# Patient Record
Sex: Male | Born: 1960 | Hispanic: Yes | Marital: Single | Smoking: Never smoker
Health system: Southern US, Community
[De-identification: ages and names within clinical notes are randomized; demographics above are authoritative.]

## PROBLEM LIST (undated history)

## (undated) DIAGNOSIS — E119 Type 2 diabetes mellitus without complications: Secondary | ICD-10-CM

## (undated) HISTORY — PX: PLEURAL SCARIFICATION: SHX748

## (undated) HISTORY — PX: FRACTURE SURGERY: SHX138

## (undated) HISTORY — PX: LUNG TRANSPLANT: SHX234

---

## 2005-07-31 ENCOUNTER — Emergency Department (HOSPITAL_COMMUNITY): Admission: EM | Admit: 2005-07-31 | Discharge: 2005-07-31 | Payer: Self-pay | Admitting: *Deleted

## 2005-08-08 ENCOUNTER — Emergency Department (HOSPITAL_COMMUNITY): Admission: EM | Admit: 2005-08-08 | Discharge: 2005-08-09 | Payer: Self-pay | Admitting: *Deleted

## 2005-08-14 ENCOUNTER — Emergency Department (HOSPITAL_COMMUNITY): Admission: EM | Admit: 2005-08-14 | Discharge: 2005-08-15 | Payer: Self-pay | Admitting: *Deleted

## 2005-08-21 ENCOUNTER — Ambulatory Visit: Payer: Self-pay | Admitting: Internal Medicine

## 2005-08-21 ENCOUNTER — Observation Stay (HOSPITAL_COMMUNITY): Admission: EM | Admit: 2005-08-21 | Discharge: 2005-08-22 | Payer: Self-pay | Admitting: Emergency Medicine

## 2005-08-24 ENCOUNTER — Ambulatory Visit (HOSPITAL_COMMUNITY): Admission: RE | Admit: 2005-08-24 | Discharge: 2005-08-24 | Payer: Self-pay | Admitting: Family Medicine

## 2020-06-03 DIAGNOSIS — Z938 Other artificial opening status: Secondary | ICD-10-CM | POA: Insufficient documentation

## 2020-06-03 DIAGNOSIS — Z9911 Dependence on respirator [ventilator] status: Secondary | ICD-10-CM

## 2020-06-03 DIAGNOSIS — Z930 Tracheostomy status: Secondary | ICD-10-CM

## 2020-06-03 DIAGNOSIS — J9601 Acute respiratory failure with hypoxia: Secondary | ICD-10-CM | POA: Insufficient documentation

## 2020-06-03 DIAGNOSIS — I3139 Other pericardial effusion (noninflammatory): Secondary | ICD-10-CM

## 2020-06-03 DIAGNOSIS — S2243XA Multiple fractures of ribs, bilateral, initial encounter for closed fracture: Secondary | ICD-10-CM

## 2020-06-03 DIAGNOSIS — I7774 Dissection of vertebral artery: Secondary | ICD-10-CM

## 2020-06-03 DIAGNOSIS — S72302G Unspecified fracture of shaft of left femur, subsequent encounter for closed fracture with delayed healing: Secondary | ICD-10-CM

## 2020-06-03 DIAGNOSIS — T07XXXA Unspecified multiple injuries, initial encounter: Secondary | ICD-10-CM | POA: Insufficient documentation

## 2020-06-03 DIAGNOSIS — S42002A Fracture of unspecified part of left clavicle, initial encounter for closed fracture: Secondary | ICD-10-CM | POA: Insufficient documentation

## 2020-06-03 DIAGNOSIS — S272XXA Traumatic hemopneumothorax, initial encounter: Secondary | ICD-10-CM

## 2020-06-03 HISTORY — DX: Dissection of vertebral artery: I77.74

## 2020-06-03 HISTORY — DX: Other artificial opening status: Z93.8

## 2020-06-03 HISTORY — DX: Multiple fractures of ribs, bilateral, initial encounter for closed fracture: S22.43XA

## 2020-06-03 HISTORY — DX: Acute respiratory failure with hypoxia: J96.01

## 2020-06-03 HISTORY — DX: Traumatic hemopneumothorax, initial encounter: S27.2XXA

## 2020-06-03 HISTORY — DX: Unspecified fracture of shaft of left femur, subsequent encounter for closed fracture with delayed healing: S72.302G

## 2020-06-03 HISTORY — DX: Other pericardial effusion (noninflammatory): I31.39

## 2020-06-03 HISTORY — DX: Unspecified multiple injuries, initial encounter: T07.XXXA

## 2020-06-03 HISTORY — DX: Tracheostomy status: Z93.0

## 2020-06-03 HISTORY — DX: Dependence on respirator (ventilator) status: Z99.11

## 2020-07-15 DIAGNOSIS — F324 Major depressive disorder, single episode, in partial remission: Secondary | ICD-10-CM

## 2020-07-15 HISTORY — DX: Major depressive disorder, single episode, in partial remission: F32.4

## 2022-03-04 ENCOUNTER — Encounter (HOSPITAL_COMMUNITY): Payer: Self-pay | Admitting: Emergency Medicine

## 2022-03-04 ENCOUNTER — Emergency Department (HOSPITAL_COMMUNITY)
Admission: EM | Admit: 2022-03-04 | Discharge: 2022-03-04 | Disposition: A | Discharge status: Home / Self Care | Payer: Medicaid Other | Attending: Emergency Medicine | Admitting: Emergency Medicine

## 2022-03-04 ENCOUNTER — Other Ambulatory Visit: Payer: Self-pay

## 2022-03-04 DIAGNOSIS — J020 Streptococcal pharyngitis: Secondary | ICD-10-CM | POA: Diagnosis not present

## 2022-03-04 DIAGNOSIS — E1165 Type 2 diabetes mellitus with hyperglycemia: Secondary | ICD-10-CM | POA: Diagnosis not present

## 2022-03-04 DIAGNOSIS — R0602 Shortness of breath: Secondary | ICD-10-CM | POA: Diagnosis not present

## 2022-03-04 DIAGNOSIS — D72829 Elevated white blood cell count, unspecified: Secondary | ICD-10-CM | POA: Diagnosis not present

## 2022-03-04 DIAGNOSIS — R059 Cough, unspecified: Secondary | ICD-10-CM | POA: Diagnosis present

## 2022-03-04 MED ORDER — PENICILLIN G BENZATHINE 1200000 UNIT/2ML IM SUSY
1.2000 10*6.[IU] | PREFILLED_SYRINGE | Freq: Once | INTRAMUSCULAR | Status: AC
  Administered 2022-03-04: 1.2 10*6.[IU] via INTRAMUSCULAR
  Filled 2022-03-04: qty 2
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
## 2022-03-04 NOTE — Discharge Instructions (Signed)
Su prueba de estreptococos en la sala de urgencias dio positivo. Le hemos dado antibi?ticos para tratar esta infecci?n. ?Puede tomar Motrin y Tylenol seg?n lo necesite seg?n las indicaciones para Fish farm manager. ?Regrese a la sala de emergencias si tiene s?ntomas graves o preocupantes; de lo contrario, vuelva a Science writer con su proveedor de atenci?n primaria. ? ?Your strep test at the urgent care was positive.  We have given you antibiotics to treat this infection. ?You can take Motrin and Tylenol as needed as directed for discomfort in your throat. ?Return to the ER for severe or concerning symptoms otherwise recheck with your primary care provider. ?

## 2022-03-04 NOTE — ED Triage Notes (Signed)
Pt was sent by [facility] in [place], pt c/o SOB, decreased appetite, weakness x 1.5 months ?

## 2022-03-04 NOTE — ED Notes (Signed)
Pt ambulated to room with cane independently on RA.  ?

## 2022-06-03 ENCOUNTER — Emergency Department (HOSPITAL_COMMUNITY): Payer: Medicaid Other

## 2022-06-03 ENCOUNTER — Inpatient Hospital Stay (HOSPITAL_COMMUNITY)
Admission: EM | Admit: 2022-06-03 | Discharge: 2022-06-06 | DRG: 871 | Disposition: A | Discharge status: Home / Self Care | Payer: Medicaid Other | Attending: Family Medicine | Admitting: Family Medicine

## 2022-06-03 ENCOUNTER — Other Ambulatory Visit: Payer: Self-pay

## 2022-06-03 ENCOUNTER — Encounter (HOSPITAL_COMMUNITY): Payer: Self-pay | Admitting: Emergency Medicine

## 2022-06-03 DIAGNOSIS — Z6820 Body mass index (BMI) 20.0-20.9, adult: Secondary | ICD-10-CM

## 2022-06-03 DIAGNOSIS — I248 Other forms of acute ischemic heart disease: Secondary | ICD-10-CM | POA: Diagnosis present

## 2022-06-03 DIAGNOSIS — J9601 Acute respiratory failure with hypoxia: Secondary | ICD-10-CM | POA: Diagnosis not present

## 2022-06-03 DIAGNOSIS — E1165 Type 2 diabetes mellitus with hyperglycemia: Secondary | ICD-10-CM | POA: Diagnosis present

## 2022-06-03 DIAGNOSIS — Z20822 Contact with and (suspected) exposure to covid-19: Secondary | ICD-10-CM | POA: Diagnosis present

## 2022-06-03 DIAGNOSIS — J189 Pneumonia, unspecified organism: Secondary | ICD-10-CM | POA: Diagnosis present

## 2022-06-03 DIAGNOSIS — E871 Hypo-osmolality and hyponatremia: Secondary | ICD-10-CM | POA: Diagnosis present

## 2022-06-03 DIAGNOSIS — E440 Moderate protein-calorie malnutrition: Secondary | ICD-10-CM | POA: Insufficient documentation

## 2022-06-03 DIAGNOSIS — A419 Sepsis, unspecified organism: Principal | ICD-10-CM | POA: Diagnosis present

## 2022-06-03 DIAGNOSIS — D509 Iron deficiency anemia, unspecified: Secondary | ICD-10-CM | POA: Diagnosis present

## 2022-06-03 DIAGNOSIS — R6520 Severe sepsis without septic shock: Secondary | ICD-10-CM | POA: Diagnosis present

## 2022-06-03 HISTORY — DX: Acute respiratory failure with hypoxia: J96.01

## 2022-06-03 HISTORY — DX: Type 2 diabetes mellitus without complications: E11.9

## 2022-06-03 LAB — CBC
HCT: 34.1 % — ABNORMAL LOW (ref 39.0–52.0)
Hemoglobin: 11.4 g/dL — ABNORMAL LOW (ref 13.0–17.0)
MCH: 29.2 pg (ref 26.0–34.0)
MCHC: 33.4 g/dL (ref 30.0–36.0)
MCV: 87.4 fL (ref 80.0–100.0)
Platelets: 187 10*3/uL (ref 150–400)
RBC: 3.9 MIL/uL — ABNORMAL LOW (ref 4.22–5.81)
RDW: 12.8 % (ref 11.5–15.5)
WBC: 12.1 10*3/uL — ABNORMAL HIGH (ref 4.0–10.5)
nRBC: 0 % (ref 0.0–0.2)

## 2022-06-03 LAB — COMPREHENSIVE METABOLIC PANEL
ALT: 27 U/L (ref 0–44)
AST: 24 U/L (ref 15–41)
Albumin: 4.5 g/dL (ref 3.5–5.0)
Alkaline Phosphatase: 90 U/L (ref 38–126)
Anion gap: 8 (ref 5–15)
BUN: 17 mg/dL (ref 6–20)
CO2: 29 mmol/L (ref 22–32)
Calcium: 9 mg/dL (ref 8.9–10.3)
Chloride: 96 mmol/L — ABNORMAL LOW (ref 98–111)
Creatinine, Ser: 0.63 mg/dL (ref 0.61–1.24)
GFR, Estimated: >60 mL/min (ref >60)
Glucose, Bld: 141 mg/dL — ABNORMAL HIGH (ref 70–99)
Potassium: 4.1 mmol/L (ref 3.5–5.1)
Sodium: 133 mmol/L — ABNORMAL LOW (ref 135–145)
Total Bilirubin: 0.6 mg/dL (ref 0.3–1.2)
Total Protein: 8.5 g/dL — ABNORMAL HIGH (ref 6.5–8.1)

## 2022-06-03 LAB — CBC WITH DIFFERENTIAL/PLATELET
Abs Immature Granulocytes: 0.07 10*3/uL (ref 0.00–0.07)
Basophils Absolute: 0.1 10*3/uL (ref 0.0–0.1)
Basophils Relative: 0 %
Eosinophils Absolute: 0.3 10*3/uL (ref 0.0–0.5)
Eosinophils Relative: 1 %
HCT: 35.8 % — ABNORMAL LOW (ref 39.0–52.0)
Hemoglobin: 12.1 g/dL — ABNORMAL LOW (ref 13.0–17.0)
Immature Granulocytes: 0 %
Lymphocytes Relative: 11 %
Lymphs Abs: 2.2 10*3/uL (ref 0.7–4.0)
MCH: 29.5 pg (ref 26.0–34.0)
MCHC: 33.8 g/dL (ref 30.0–36.0)
MCV: 87.3 fL (ref 80.0–100.0)
Monocytes Absolute: 0.7 10*3/uL (ref 0.1–1.0)
Monocytes Relative: 3 %
Neutro Abs: 17.1 10*3/uL — ABNORMAL HIGH (ref 1.7–7.7)
Neutrophils Relative %: 85 %
Platelets: 215 10*3/uL (ref 150–400)
RBC: 4.1 MIL/uL — ABNORMAL LOW (ref 4.22–5.81)
RDW: 12.6 % (ref 11.5–15.5)
WBC: 20.5 10*3/uL — ABNORMAL HIGH (ref 4.0–10.5)
nRBC: 0 % (ref 0.0–0.2)

## 2022-06-03 LAB — IRON AND TIBC
Iron: 25 ug/dL — ABNORMAL LOW (ref 45–182)
Saturation Ratios: 12 % — ABNORMAL LOW (ref 17.9–39.5)
TIBC: 217 ug/dL — ABNORMAL LOW (ref 250–450)
UIBC: 192 ug/dL

## 2022-06-03 LAB — GLUCOSE, CAPILLARY: Glucose-Capillary: 94 mg/dL (ref 70–99)

## 2022-06-03 LAB — URINALYSIS, ROUTINE W REFLEX MICROSCOPIC
Bacteria, UA: NONE SEEN
Bilirubin Urine: NEGATIVE
Glucose, UA: NEGATIVE mg/dL
Ketones, ur: NEGATIVE mg/dL
Leukocytes,Ua: NEGATIVE
Nitrite: NEGATIVE
Protein, ur: 30 mg/dL — AB
Specific Gravity, Urine: 1.009 (ref 1.005–1.030)
pH: 7 (ref 5.0–8.0)

## 2022-06-03 LAB — PROTIME-INR
INR: 1.1 (ref 0.8–1.2)
Prothrombin Time: 13.9 seconds (ref 11.4–15.2)

## 2022-06-03 LAB — APTT: aPTT: 38 seconds — ABNORMAL HIGH (ref 24–36)

## 2022-06-03 LAB — RESP PANEL BY RT-PCR (FLU A&B, COVID) ARPGX2
Influenza A by PCR: NEGATIVE
Influenza B by PCR: NEGATIVE
SARS Coronavirus 2 by RT PCR: NEGATIVE

## 2022-06-03 LAB — FERRITIN: Ferritin: 321 ng/mL (ref 24–336)

## 2022-06-03 LAB — RETICULOCYTES
Immature Retic Fract: 6.2 % (ref 2.3–15.9)
RBC.: 3.87 MIL/uL — ABNORMAL LOW (ref 4.22–5.81)
Retic Count, Absolute: 39.5 10*3/uL (ref 19.0–186.0)
Retic Ct Pct: 1 % (ref 0.4–3.1)

## 2022-06-03 LAB — CBG MONITORING, ED: Glucose-Capillary: 124 mg/dL — ABNORMAL HIGH (ref 70–99)

## 2022-06-03 LAB — D-DIMER, QUANTITATIVE: D-Dimer, Quant: 0.67 ug/mL-FEU — ABNORMAL HIGH (ref 0.00–0.50)

## 2022-06-03 LAB — VITAMIN B12: Vitamin B-12: 396 pg/mL (ref 180–914)

## 2022-06-03 LAB — TROPONIN I (HIGH SENSITIVITY)
Troponin I (High Sensitivity): 3 ng/L (ref <18)
Troponin I (High Sensitivity): 27 ng/L — ABNORMAL HIGH (ref <18)

## 2022-06-03 LAB — MAGNESIUM: Magnesium: 1.6 mg/dL — ABNORMAL LOW (ref 1.7–2.4)

## 2022-06-03 LAB — FOLATE: Folate: 4.9 ng/mL — ABNORMAL LOW (ref >5.9)

## 2022-06-03 LAB — LACTIC ACID, PLASMA
Lactic Acid, Venous: 1.1 mmol/L (ref 0.5–1.9)
Lactic Acid, Venous: 0.7 mmol/L (ref 0.5–1.9)

## 2022-06-03 LAB — BRAIN NATRIURETIC PEPTIDE: B Natriuretic Peptide: 84 pg/mL (ref 0.0–100.0)

## 2022-06-03 LAB — CREATININE, SERUM
Creatinine, Ser: 0.6 mg/dL — ABNORMAL LOW (ref 0.61–1.24)
GFR, Estimated: >60 mL/min (ref >60)

## 2022-06-03 MED ORDER — ACETAMINOPHEN 650 MG RE SUPP
650.0000 mg | Freq: Once | RECTAL | Status: AC
Start: 2022-06-03 — End: 2022-06-03

## 2022-06-03 MED ORDER — ALBUTEROL SULFATE (2.5 MG/3ML) 0.083% IN NEBU: 2.5000 mg | INHALATION_SOLUTION | Freq: Four times a day (QID) | RESPIRATORY_TRACT | Status: DC | PRN

## 2022-06-03 MED ORDER — ONDANSETRON HCL 4 MG/2ML IJ SOLN
4.0000 mg | Freq: Four times a day (QID) | INTRAMUSCULAR | Status: DC | PRN
  Administered 2022-06-04 – 2022-06-05 (×2): 4 mg via INTRAVENOUS
  Filled 2022-06-03 (×2): qty 2

## 2022-06-03 MED ORDER — ACETAMINOPHEN 325 MG PO TABS
650.0000 mg | ORAL_TABLET | Freq: Once | ORAL | Status: DC
  Filled 2022-06-03: qty 2

## 2022-06-03 MED ORDER — ACETAMINOPHEN 325 MG PO TABS
650.0000 mg | ORAL_TABLET | Freq: Four times a day (QID) | ORAL | Status: DC | PRN
  Administered 2022-06-04: 650 mg via ORAL
  Filled 2022-06-03: qty 2

## 2022-06-03 MED ORDER — SODIUM CHLORIDE 0.9 % IV SOLN: INTRAVENOUS | Status: DC

## 2022-06-03 MED ORDER — LIDOCAINE-EPINEPHRINE (PF) 2 %-1:200000 IJ SOLN
10.0000 mL | Freq: Once | INTRAMUSCULAR | Status: DC
  Filled 2022-06-03: qty 20

## 2022-06-03 MED ORDER — SODIUM CHLORIDE 0.9 % IV SOLN
500.0000 mg | INTRAVENOUS | Status: DC
  Administered 2022-06-03 – 2022-06-04 (×2): 500 mg via INTRAVENOUS
  Filled 2022-06-03 (×2): qty 5

## 2022-06-03 MED ORDER — ACETAMINOPHEN 650 MG RE SUPP
RECTAL | Status: AC
  Administered 2022-06-03: 650 mg via RECTAL
  Filled 2022-06-03: qty 1

## 2022-06-03 MED ORDER — INSULIN ASPART 100 UNIT/ML IJ SOLN
0.0000 [IU] | Freq: Three times a day (TID) | INTRAMUSCULAR | Status: DC
  Administered 2022-06-05: 3 [IU] via SUBCUTANEOUS
  Administered 2022-06-05 – 2022-06-06 (×3): 1 [IU] via SUBCUTANEOUS

## 2022-06-03 MED ORDER — ACETAMINOPHEN 650 MG RE SUPP: 650.0000 mg | Freq: Four times a day (QID) | RECTAL | Status: DC | PRN

## 2022-06-03 MED ORDER — LACTATED RINGERS IV BOLUS (SEPSIS)
1000.0000 mL | Freq: Once | INTRAVENOUS | Status: AC
  Administered 2022-06-03: 1000 mL via INTRAVENOUS

## 2022-06-03 MED ORDER — GUAIFENESIN-DM 100-10 MG/5ML PO SYRP
5.0000 mL | ORAL_SOLUTION | ORAL | Status: DC | PRN
Start: 2022-06-03 — End: 2022-06-04
  Administered 2022-06-04: 5 mL via ORAL
  Filled 2022-06-03: qty 5

## 2022-06-03 MED ORDER — ALBUTEROL SULFATE HFA 108 (90 BASE) MCG/ACT IN AERS
1.0000 | INHALATION_SPRAY | Freq: Four times a day (QID) | RESPIRATORY_TRACT | Status: DC | PRN
Start: 2022-06-03 — End: 2022-06-03

## 2022-06-03 MED ORDER — LACTATED RINGERS IV BOLUS (SEPSIS)
1000.0000 mL | Freq: Once | INTRAVENOUS | Status: AC
Start: 2022-06-03 — End: 2022-06-03
  Administered 2022-06-03: 1000 mL via INTRAVENOUS

## 2022-06-03 MED ORDER — ENOXAPARIN SODIUM 40 MG/0.4ML IJ SOSY
40.0000 mg | PREFILLED_SYRINGE | INTRAMUSCULAR | Status: DC
  Administered 2022-06-03 – 2022-06-05 (×3): 40 mg via SUBCUTANEOUS
  Filled 2022-06-03 (×3): qty 0.4

## 2022-06-03 MED ORDER — INSULIN ASPART 100 UNIT/ML IJ SOLN
0.0000 [IU] | Freq: Every day | INTRAMUSCULAR | Status: DC
  Administered 2022-06-05: 3 [IU] via SUBCUTANEOUS

## 2022-06-03 MED ORDER — LACTATED RINGERS IV SOLN: INTRAVENOUS | Status: DC

## 2022-06-03 MED ORDER — SODIUM CHLORIDE 0.9 % IV SOLN
2.0000 g | INTRAVENOUS | Status: DC
  Administered 2022-06-03 – 2022-06-05 (×3): 2 g via INTRAVENOUS
  Filled 2022-06-03 (×3): qty 20

## 2022-06-03 MED ORDER — MAGNESIUM SULFATE 2 GM/50ML IV SOLN
2.0000 g | Freq: Once | INTRAVENOUS | Status: AC
Start: 2022-06-03 — End: 2022-06-03
  Administered 2022-06-03: 2 g via INTRAVENOUS
  Filled 2022-06-03: qty 50

## 2022-06-03 MED ORDER — ONDANSETRON HCL 4 MG PO TABS
4.0000 mg | ORAL_TABLET | Freq: Four times a day (QID) | ORAL | Status: DC | PRN
  Administered 2022-06-04: 4 mg via ORAL
  Filled 2022-06-03: qty 1
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
## 2022-06-03 NOTE — ED Triage Notes (Signed)
SOB and chest pain x 2 hours.
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
## 2022-06-03 NOTE — Sepsis Progress Note (Signed)
ELink tracking the code sepsis.  

## 2022-06-03 NOTE — ED Notes (Signed)
Patient verbalizes difficulty swallowing medication.

## 2022-06-04 ENCOUNTER — Inpatient Hospital Stay (HOSPITAL_COMMUNITY): Payer: Medicaid Other

## 2022-06-04 ENCOUNTER — Encounter (HOSPITAL_COMMUNITY): Payer: Self-pay | Admitting: Family Medicine

## 2022-06-04 ENCOUNTER — Observation Stay (HOSPITAL_COMMUNITY): Payer: Medicaid Other

## 2022-06-04 DIAGNOSIS — R6520 Severe sepsis without septic shock: Secondary | ICD-10-CM

## 2022-06-04 DIAGNOSIS — E871 Hypo-osmolality and hyponatremia: Secondary | ICD-10-CM | POA: Diagnosis present

## 2022-06-04 DIAGNOSIS — A419 Sepsis, unspecified organism: Secondary | ICD-10-CM | POA: Diagnosis present

## 2022-06-04 DIAGNOSIS — Z6820 Body mass index (BMI) 20.0-20.9, adult: Secondary | ICD-10-CM | POA: Diagnosis not present

## 2022-06-04 DIAGNOSIS — I248 Other forms of acute ischemic heart disease: Secondary | ICD-10-CM | POA: Diagnosis present

## 2022-06-04 DIAGNOSIS — Z20822 Contact with and (suspected) exposure to covid-19: Secondary | ICD-10-CM | POA: Diagnosis present

## 2022-06-04 DIAGNOSIS — E1165 Type 2 diabetes mellitus with hyperglycemia: Secondary | ICD-10-CM | POA: Diagnosis present

## 2022-06-04 DIAGNOSIS — J9601 Acute respiratory failure with hypoxia: Secondary | ICD-10-CM

## 2022-06-04 DIAGNOSIS — D509 Iron deficiency anemia, unspecified: Secondary | ICD-10-CM | POA: Diagnosis present

## 2022-06-04 DIAGNOSIS — R778 Other specified abnormalities of plasma proteins: Secondary | ICD-10-CM | POA: Diagnosis not present

## 2022-06-04 DIAGNOSIS — E440 Moderate protein-calorie malnutrition: Secondary | ICD-10-CM | POA: Diagnosis present

## 2022-06-04 DIAGNOSIS — J189 Pneumonia, unspecified organism: Secondary | ICD-10-CM | POA: Diagnosis present

## 2022-06-04 HISTORY — DX: Hypomagnesemia: E83.42

## 2022-06-04 HISTORY — DX: Hypo-osmolality and hyponatremia: E87.1

## 2022-06-04 HISTORY — DX: Sepsis, unspecified organism: A41.9

## 2022-06-04 LAB — GLUCOSE, CAPILLARY
Glucose-Capillary: 90 mg/dL (ref 70–99)
Glucose-Capillary: 107 mg/dL — ABNORMAL HIGH (ref 70–99)
Glucose-Capillary: 114 mg/dL — ABNORMAL HIGH (ref 70–99)
Glucose-Capillary: 119 mg/dL — ABNORMAL HIGH (ref 70–99)

## 2022-06-04 LAB — CBC
HCT: 30.2 % — ABNORMAL LOW (ref 39.0–52.0)
Hemoglobin: 9.9 g/dL — ABNORMAL LOW (ref 13.0–17.0)
MCH: 28.9 pg (ref 26.0–34.0)
MCHC: 32.8 g/dL (ref 30.0–36.0)
MCV: 88 fL (ref 80.0–100.0)
Platelets: 172 10*3/uL (ref 150–400)
RBC: 3.43 MIL/uL — ABNORMAL LOW (ref 4.22–5.81)
RDW: 13 % (ref 11.5–15.5)
WBC: 19 10*3/uL — ABNORMAL HIGH (ref 4.0–10.5)
nRBC: 0 % (ref 0.0–0.2)

## 2022-06-04 LAB — LIPID PANEL
Cholesterol: 97 mg/dL (ref 0–200)
HDL: 44 mg/dL (ref >40)
LDL Cholesterol: 45 mg/dL (ref 0–99)
Total CHOL/HDL Ratio: 2.2 RATIO
Triglycerides: 39 mg/dL (ref <150)
VLDL: 8 mg/dL (ref 0–40)

## 2022-06-04 LAB — ECHOCARDIOGRAM COMPLETE
AR max vel: 2.38 cm2
AV Area VTI: 2.54 cm2
AV Area mean vel: 2.51 cm2
AV Mean grad: 5 mmHg
AV Peak grad: 9.7 mmHg
Ao pk vel: 1.56 m/s
Area-P 1/2: 5.16 cm2
Calc EF: 71 %
Height: 67 in
MV VTI: 3.57 cm2
S' Lateral: 2.5 cm
Single Plane A2C EF: 71.4 %
Single Plane A4C EF: 68.1 %
Weight: 2119.94 oz

## 2022-06-04 LAB — BASIC METABOLIC PANEL
Anion gap: 4 — ABNORMAL LOW (ref 5–15)
BUN: 17 mg/dL (ref 6–20)
CO2: 29 mmol/L (ref 22–32)
Calcium: 7.9 mg/dL — ABNORMAL LOW (ref 8.9–10.3)
Chloride: 101 mmol/L (ref 98–111)
Creatinine, Ser: 0.73 mg/dL (ref 0.61–1.24)
GFR, Estimated: >60 mL/min (ref >60)
Glucose, Bld: 89 mg/dL (ref 70–99)
Potassium: 4 mmol/L (ref 3.5–5.1)
Sodium: 134 mmol/L — ABNORMAL LOW (ref 135–145)

## 2022-06-04 LAB — STREP PNEUMONIAE URINARY ANTIGEN: Strep Pneumo Urinary Antigen: NEGATIVE

## 2022-06-04 LAB — LACTIC ACID, PLASMA: Lactic Acid, Venous: 0.7 mmol/L (ref 0.5–1.9)

## 2022-06-04 LAB — HEMOGLOBIN A1C
Hgb A1c MFr Bld: 6 % — ABNORMAL HIGH (ref 4.8–5.6)
Mean Plasma Glucose: 125.5 mg/dL

## 2022-06-04 LAB — MAGNESIUM: Magnesium: 2 mg/dL (ref 1.7–2.4)

## 2022-06-04 LAB — PROCALCITONIN: Procalcitonin: 3.77 ng/mL

## 2022-06-04 MED ORDER — LACTATED RINGERS IV BOLUS (SEPSIS)
1000.0000 mL | Freq: Once | INTRAVENOUS | Status: AC
Start: 2022-06-04 — End: 2022-06-04
  Administered 2022-06-04: 1000 mL via INTRAVENOUS

## 2022-06-04 MED ORDER — GUAIFENESIN-DM 100-10 MG/5ML PO SYRP
10.0000 mL | ORAL_SOLUTION | Freq: Four times a day (QID) | ORAL | Status: DC
Start: 2022-06-04 — End: 2022-06-06
  Administered 2022-06-04 – 2022-06-06 (×7): 10 mL via ORAL
  Filled 2022-06-04 (×7): qty 10

## 2022-06-04 MED ORDER — MIDODRINE HCL 5 MG PO TABS
10.0000 mg | ORAL_TABLET | Freq: Three times a day (TID) | ORAL | Status: DC
  Administered 2022-06-04 – 2022-06-05 (×4): 10 mg via ORAL
  Filled 2022-06-04 (×4): qty 2

## 2022-06-04 MED ORDER — IOHEXOL 350 MG/ML SOLN
75.0000 mL | Freq: Once | INTRAVENOUS | Status: AC | PRN
  Administered 2022-06-04: 75 mL via INTRAVENOUS

## 2022-06-04 MED ORDER — FOLIC ACID 1 MG PO TABS
1.0000 mg | ORAL_TABLET | Freq: Every day | ORAL | Status: DC
  Administered 2022-06-04 – 2022-06-06 (×3): 1 mg via ORAL
  Filled 2022-06-04 (×3): qty 1

## 2022-06-04 MED ORDER — RISAQUAD PO CAPS
1.0000 | ORAL_CAPSULE | Freq: Three times a day (TID) | ORAL | Status: DC
  Administered 2022-06-04 – 2022-06-06 (×7): 1 via ORAL
  Filled 2022-06-04 (×7): qty 1

## 2022-06-04 MED ORDER — ENSURE ENLIVE PO LIQD
237.0000 mL | Freq: Two times a day (BID) | ORAL | Status: DC
Start: 2022-06-04 — End: 2022-06-06
  Administered 2022-06-04 – 2022-06-05 (×3): 237 mL via ORAL

## 2022-06-04 MED ORDER — ADULT MULTIVITAMIN W/MINERALS CH
1.0000 | ORAL_TABLET | Freq: Every day | ORAL | Status: DC
  Administered 2022-06-04 – 2022-06-06 (×3): 1 via ORAL
  Filled 2022-06-04 (×3): qty 1

## 2022-06-04 MED ORDER — FERROUS SULFATE 325 (65 FE) MG PO TABS
325.0000 mg | ORAL_TABLET | Freq: Every day | ORAL | Status: DC
  Administered 2022-06-05 – 2022-06-06 (×2): 325 mg via ORAL
  Filled 2022-06-04 (×2): qty 1

## 2022-06-04 MED ORDER — ORAL CARE MOUTH RINSE: 15.0000 mL | OROMUCOSAL | Status: DC | PRN

## 2022-06-04 NOTE — Progress Notes (Signed)
*  PRELIMINARY RESULTS* Echocardiogram 2D Echocardiogram has been performed.  Carolyne Fiscal 06/04/2022, 2:03 PM
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
## 2022-06-05 DIAGNOSIS — E871 Hypo-osmolality and hyponatremia: Secondary | ICD-10-CM | POA: Diagnosis not present

## 2022-06-05 DIAGNOSIS — E440 Moderate protein-calorie malnutrition: Secondary | ICD-10-CM | POA: Insufficient documentation

## 2022-06-05 DIAGNOSIS — J9601 Acute respiratory failure with hypoxia: Secondary | ICD-10-CM | POA: Diagnosis not present

## 2022-06-05 DIAGNOSIS — A419 Sepsis, unspecified organism: Secondary | ICD-10-CM | POA: Diagnosis not present

## 2022-06-05 HISTORY — DX: Moderate protein-calorie malnutrition: E44.0

## 2022-06-05 LAB — BASIC METABOLIC PANEL
Anion gap: 2 — ABNORMAL LOW (ref 5–15)
BUN: 12 mg/dL (ref 6–20)
CO2: 29 mmol/L (ref 22–32)
Calcium: 7.8 mg/dL — ABNORMAL LOW (ref 8.9–10.3)
Chloride: 106 mmol/L (ref 98–111)
Creatinine, Ser: 0.7 mg/dL (ref 0.61–1.24)
GFR, Estimated: >60 mL/min (ref >60)
Glucose, Bld: 87 mg/dL (ref 70–99)
Potassium: 3.9 mmol/L (ref 3.5–5.1)
Sodium: 137 mmol/L (ref 135–145)

## 2022-06-05 LAB — CBC
HCT: 28.1 % — ABNORMAL LOW (ref 39.0–52.0)
Hemoglobin: 9.1 g/dL — ABNORMAL LOW (ref 13.0–17.0)
MCH: 29.3 pg (ref 26.0–34.0)
MCHC: 32.4 g/dL (ref 30.0–36.0)
MCV: 90.4 fL (ref 80.0–100.0)
Platelets: 151 10*3/uL (ref 150–400)
RBC: 3.11 MIL/uL — ABNORMAL LOW (ref 4.22–5.81)
RDW: 13.2 % (ref 11.5–15.5)
WBC: 14.7 10*3/uL — ABNORMAL HIGH (ref 4.0–10.5)
nRBC: 0 % (ref 0.0–0.2)

## 2022-06-05 LAB — GLUCOSE, CAPILLARY
Glucose-Capillary: 84 mg/dL (ref 70–99)
Glucose-Capillary: 147 mg/dL — ABNORMAL HIGH (ref 70–99)
Glucose-Capillary: 225 mg/dL — ABNORMAL HIGH (ref 70–99)
Glucose-Capillary: 257 mg/dL — ABNORMAL HIGH (ref 70–99)

## 2022-06-05 LAB — HIV ANTIBODY (ROUTINE TESTING W REFLEX): HIV Screen 4th Generation wRfx: NONREACTIVE

## 2022-06-05 MED ORDER — SODIUM CHLORIDE 0.9 % IV SOLN: INTRAVENOUS | Status: DC

## 2022-06-05 MED ORDER — PREDNISOLONE 5 MG PO TABS
40.0000 mg | ORAL_TABLET | Freq: Every day | ORAL | Status: DC
  Administered 2022-06-05 – 2022-06-06 (×2): 40 mg via ORAL
  Filled 2022-06-05 (×4): qty 8

## 2022-06-05 MED ORDER — AZITHROMYCIN 250 MG PO TABS
500.0000 mg | ORAL_TABLET | Freq: Every day | ORAL | Status: DC
Start: 2022-06-05 — End: 2022-06-06
  Administered 2022-06-05 – 2022-06-06 (×2): 500 mg via ORAL
  Filled 2022-06-05 (×2): qty 2

## 2022-06-06 DIAGNOSIS — J9601 Acute respiratory failure with hypoxia: Secondary | ICD-10-CM | POA: Diagnosis not present

## 2022-06-06 DIAGNOSIS — E871 Hypo-osmolality and hyponatremia: Secondary | ICD-10-CM | POA: Diagnosis not present

## 2022-06-06 DIAGNOSIS — A419 Sepsis, unspecified organism: Secondary | ICD-10-CM | POA: Diagnosis not present

## 2022-06-06 LAB — BASIC METABOLIC PANEL
Anion gap: 5 (ref 5–15)
BUN: 14 mg/dL (ref 6–20)
CO2: 27 mmol/L (ref 22–32)
Calcium: 8 mg/dL — ABNORMAL LOW (ref 8.9–10.3)
Chloride: 103 mmol/L (ref 98–111)
Creatinine, Ser: 0.59 mg/dL — ABNORMAL LOW (ref 0.61–1.24)
GFR, Estimated: >60 mL/min (ref >60)
Glucose, Bld: 225 mg/dL — ABNORMAL HIGH (ref 70–99)
Potassium: 3.9 mmol/L (ref 3.5–5.1)
Sodium: 135 mmol/L (ref 135–145)

## 2022-06-06 LAB — CBC
HCT: 30.6 % — ABNORMAL LOW (ref 39.0–52.0)
Hemoglobin: 10.1 g/dL — ABNORMAL LOW (ref 13.0–17.0)
MCH: 29.2 pg (ref 26.0–34.0)
MCHC: 33 g/dL (ref 30.0–36.0)
MCV: 88.4 fL (ref 80.0–100.0)
Platelets: 174 10*3/uL (ref 150–400)
RBC: 3.46 MIL/uL — ABNORMAL LOW (ref 4.22–5.81)
RDW: 13.2 % (ref 11.5–15.5)
WBC: 12.1 10*3/uL — ABNORMAL HIGH (ref 4.0–10.5)
nRBC: 0 % (ref 0.0–0.2)

## 2022-06-06 LAB — GLUCOSE, CAPILLARY
Glucose-Capillary: 122 mg/dL — ABNORMAL HIGH (ref 70–99)
Glucose-Capillary: 135 mg/dL — ABNORMAL HIGH (ref 70–99)

## 2022-06-06 LAB — PROCALCITONIN: Procalcitonin: 1.18 ng/mL

## 2022-06-06 MED ORDER — METHYLPREDNISOLONE 4 MG PO TBPK: ORAL_TABLET | ORAL | 0 refills | Status: DC

## 2022-06-06 MED ORDER — LEVOFLOXACIN 750 MG PO TABS: 750.0000 mg | ORAL_TABLET | Freq: Every day | ORAL | 0 refills | Status: AC

## 2022-06-06 MED ORDER — METFORMIN HCL 500 MG PO TABS: 500.0000 mg | ORAL_TABLET | Freq: Every day | ORAL | 3 refills | Status: AC

## 2022-06-06 MED ORDER — FERROUS SULFATE 325 (65 FE) MG PO TABS: 325.0000 mg | ORAL_TABLET | Freq: Every day | ORAL | 0 refills | Status: DC

## 2022-06-06 MED ORDER — ACETAMINOPHEN 325 MG PO TABS: 650.0000 mg | ORAL_TABLET | Freq: Four times a day (QID) | ORAL | 0 refills | Status: AC | PRN

## 2022-06-06 MED ORDER — RISAQUAD PO CAPS: 1.0000 | ORAL_CAPSULE | Freq: Three times a day (TID) | ORAL | 0 refills | Status: AC

## 2022-06-06 MED ORDER — MIDODRINE HCL 5 MG PO TABS
5.0000 mg | ORAL_TABLET | Freq: Three times a day (TID) | ORAL | Status: DC
  Administered 2022-06-06: 5 mg via ORAL
  Filled 2022-06-06: qty 1

## 2022-06-06 MED ORDER — INSULIN ASPART 100 UNIT/ML IJ SOLN: 0.0000 [IU] | Freq: Three times a day (TID) | INTRAMUSCULAR | 11 refills | Status: DC

## 2022-06-06 MED ORDER — METFORMIN HCL 500 MG PO TABS: 500.0000 mg | ORAL_TABLET | Freq: Every day | ORAL | Status: DC

## 2022-06-06 MED ORDER — ADULT MULTIVITAMIN W/MINERALS CH: 1.0000 | ORAL_TABLET | Freq: Every day | ORAL | 0 refills | Status: AC

## 2022-06-06 MED ORDER — GUAIFENESIN-DM 100-10 MG/5ML PO SYRP: 10.0000 mL | ORAL_SOLUTION | Freq: Four times a day (QID) | ORAL | 0 refills | Status: DC

## 2022-06-06 MED ORDER — FOLIC ACID 1 MG PO TABS: 1.0000 mg | ORAL_TABLET | Freq: Every day | ORAL | 0 refills | Status: AC

## 2022-06-08 LAB — CULTURE, BLOOD (ROUTINE X 2)
Culture: NO GROWTH
Culture: NO GROWTH
Special Requests: ADEQUATE
Special Requests: ADEQUATE

## 2022-06-08 LAB — LEGIONELLA PNEUMOPHILA SEROGP 1 UR AG: L. pneumophila Serogp 1 Ur Ag: NEGATIVE

## 2022-06-10 ENCOUNTER — Encounter (HOSPITAL_COMMUNITY): Payer: Self-pay | Admitting: Emergency Medicine

## 2022-06-10 ENCOUNTER — Emergency Department (HOSPITAL_COMMUNITY)
Admission: EM | Admit: 2022-06-10 | Discharge: 2022-06-10 | Disposition: A | Discharge status: Home / Self Care | Payer: Medicaid Other | Attending: Emergency Medicine | Admitting: Emergency Medicine

## 2022-06-10 ENCOUNTER — Other Ambulatory Visit: Payer: Self-pay

## 2022-06-10 ENCOUNTER — Emergency Department (HOSPITAL_COMMUNITY): Payer: Medicaid Other

## 2022-06-10 DIAGNOSIS — D72829 Elevated white blood cell count, unspecified: Secondary | ICD-10-CM | POA: Diagnosis not present

## 2022-06-10 DIAGNOSIS — Z794 Long term (current) use of insulin: Secondary | ICD-10-CM | POA: Diagnosis not present

## 2022-06-10 DIAGNOSIS — K5900 Constipation, unspecified: Secondary | ICD-10-CM | POA: Diagnosis not present

## 2022-06-10 DIAGNOSIS — R109 Unspecified abdominal pain: Secondary | ICD-10-CM | POA: Diagnosis present

## 2022-06-10 LAB — CBC
HCT: 33.9 % — ABNORMAL LOW (ref 39.0–52.0)
Hemoglobin: 11.2 g/dL — ABNORMAL LOW (ref 13.0–17.0)
MCH: 29.5 pg (ref 26.0–34.0)
MCHC: 33 g/dL (ref 30.0–36.0)
MCV: 89.2 fL (ref 80.0–100.0)
Platelets: 297 10*3/uL (ref 150–400)
RBC: 3.8 MIL/uL — ABNORMAL LOW (ref 4.22–5.81)
RDW: 13.1 % (ref 11.5–15.5)
WBC: 13.7 10*3/uL — ABNORMAL HIGH (ref 4.0–10.5)
nRBC: 0 % (ref 0.0–0.2)

## 2022-06-10 LAB — URINALYSIS, ROUTINE W REFLEX MICROSCOPIC
Bilirubin Urine: NEGATIVE
Glucose, UA: NEGATIVE mg/dL
Hgb urine dipstick: NEGATIVE
Ketones, ur: NEGATIVE mg/dL
Leukocytes,Ua: NEGATIVE
Nitrite: NEGATIVE
Protein, ur: NEGATIVE mg/dL
Specific Gravity, Urine: 1.01 (ref 1.005–1.030)
pH: 8 (ref 5.0–8.0)

## 2022-06-10 LAB — COMPREHENSIVE METABOLIC PANEL
ALT: 25 U/L (ref 0–44)
AST: 17 U/L (ref 15–41)
Albumin: 3.7 g/dL (ref 3.5–5.0)
Alkaline Phosphatase: 81 U/L (ref 38–126)
Anion gap: 7 (ref 5–15)
BUN: 22 mg/dL — ABNORMAL HIGH (ref 6–20)
CO2: 31 mmol/L (ref 22–32)
Calcium: 9 mg/dL (ref 8.9–10.3)
Chloride: 99 mmol/L (ref 98–111)
Creatinine, Ser: 0.7 mg/dL (ref 0.61–1.24)
GFR, Estimated: >60 mL/min (ref >60)
Glucose, Bld: 179 mg/dL — ABNORMAL HIGH (ref 70–99)
Potassium: 4.9 mmol/L (ref 3.5–5.1)
Sodium: 137 mmol/L (ref 135–145)
Total Bilirubin: 0.5 mg/dL (ref 0.3–1.2)
Total Protein: 7 g/dL (ref 6.5–8.1)

## 2022-06-10 LAB — LIPASE, BLOOD: Lipase: 24 U/L (ref 11–51)

## 2022-06-10 MED ORDER — IOHEXOL 300 MG/ML  SOLN
100.0000 mL | Freq: Once | INTRAMUSCULAR | Status: AC | PRN
  Administered 2022-06-10: 100 mL via INTRAVENOUS

## 2022-06-10 MED ORDER — MORPHINE SULFATE (PF) 4 MG/ML IV SOLN
4.0000 mg | Freq: Once | INTRAVENOUS | Status: AC
  Administered 2022-06-10: 4 mg via INTRAVENOUS
  Filled 2022-06-10: qty 1

## 2022-06-10 MED ORDER — ONDANSETRON HCL 4 MG/2ML IJ SOLN
4.0000 mg | Freq: Once | INTRAMUSCULAR | Status: AC
  Administered 2022-06-10: 4 mg via INTRAVENOUS
  Filled 2022-06-10: qty 2

## 2022-06-10 MED ORDER — SODIUM CHLORIDE 0.9 % IV BOLUS
1000.0000 mL | Freq: Once | INTRAVENOUS | Status: AC
  Administered 2022-06-10: 1000 mL via INTRAVENOUS

## 2022-06-10 NOTE — ED Triage Notes (Signed)
Pt presents with abdominal pain and constipation x 6 days.

## 2022-06-10 NOTE — Discharge Instructions (Signed)
Thankfully the x-rays and the CT scan and blood work are reassuring and show that you are very constipated.  You will need to treat this with the medications that I have listed below.  I would like for you to go to the pharmacy tomorrow morning and get the following medications  1 bottle of magnesium citrate, it is about 6 ounces, drink the whole bottle  MiraLAX powder, take 1 scoop of powder 3 times a day until you are having regular soft or loose bowel movements  If you should develop severe or worsening symptoms return to the emergency department.
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
## 2022-06-10 NOTE — ED Notes (Signed)
Patient transported to CT 

## 2022-09-01 ENCOUNTER — Emergency Department (HOSPITAL_COMMUNITY): Payer: Medicaid Other

## 2022-09-01 ENCOUNTER — Other Ambulatory Visit: Payer: Self-pay

## 2022-09-01 ENCOUNTER — Emergency Department (HOSPITAL_COMMUNITY)
Admission: EM | Admit: 2022-09-01 | Discharge: 2022-09-01 | Disposition: A | Discharge status: Home / Self Care | Payer: Medicaid Other | Attending: Emergency Medicine | Admitting: Emergency Medicine

## 2022-09-01 ENCOUNTER — Encounter (HOSPITAL_COMMUNITY): Payer: Self-pay

## 2022-09-01 DIAGNOSIS — R079 Chest pain, unspecified: Secondary | ICD-10-CM | POA: Insufficient documentation

## 2022-09-01 DIAGNOSIS — Z7984 Long term (current) use of oral hypoglycemic drugs: Secondary | ICD-10-CM | POA: Insufficient documentation

## 2022-09-01 DIAGNOSIS — G8929 Other chronic pain: Secondary | ICD-10-CM | POA: Diagnosis not present

## 2022-09-01 DIAGNOSIS — M25512 Pain in left shoulder: Secondary | ICD-10-CM | POA: Diagnosis present

## 2022-09-01 DIAGNOSIS — Z794 Long term (current) use of insulin: Secondary | ICD-10-CM | POA: Insufficient documentation

## 2022-09-01 DIAGNOSIS — E119 Type 2 diabetes mellitus without complications: Secondary | ICD-10-CM | POA: Insufficient documentation

## 2022-09-01 LAB — BASIC METABOLIC PANEL
Anion gap: 7 (ref 5–15)
BUN: 29 mg/dL — ABNORMAL HIGH (ref 6–20)
CO2: 27 mmol/L (ref 22–32)
Calcium: 8.9 mg/dL (ref 8.9–10.3)
Chloride: 94 mmol/L — ABNORMAL LOW (ref 98–111)
Creatinine, Ser: 1.12 mg/dL (ref 0.61–1.24)
GFR, Estimated: >60 mL/min (ref >60)
Glucose, Bld: 99 mg/dL (ref 70–99)
Potassium: 4.9 mmol/L (ref 3.5–5.1)
Sodium: 128 mmol/L — ABNORMAL LOW (ref 135–145)

## 2022-09-01 LAB — CBC WITH DIFFERENTIAL/PLATELET
Abs Immature Granulocytes: 0.03 10*3/uL (ref 0.00–0.07)
Basophils Absolute: 0.1 10*3/uL (ref 0.0–0.1)
Basophils Relative: 1 %
Eosinophils Absolute: 0.4 10*3/uL (ref 0.0–0.5)
Eosinophils Relative: 4 %
HCT: 32.7 % — ABNORMAL LOW (ref 39.0–52.0)
Hemoglobin: 11 g/dL — ABNORMAL LOW (ref 13.0–17.0)
Immature Granulocytes: 0 %
Lymphocytes Relative: 21 %
Lymphs Abs: 2.3 10*3/uL (ref 0.7–4.0)
MCH: 29.2 pg (ref 26.0–34.0)
MCHC: 33.6 g/dL (ref 30.0–36.0)
MCV: 86.7 fL (ref 80.0–100.0)
Monocytes Absolute: 0.8 10*3/uL (ref 0.1–1.0)
Monocytes Relative: 7 %
Neutro Abs: 7.5 10*3/uL (ref 1.7–7.7)
Neutrophils Relative %: 67 %
Platelets: 280 10*3/uL (ref 150–400)
RBC: 3.77 MIL/uL — ABNORMAL LOW (ref 4.22–5.81)
RDW: 12.7 % (ref 11.5–15.5)
WBC: 11.1 10*3/uL — ABNORMAL HIGH (ref 4.0–10.5)
nRBC: 0 % (ref 0.0–0.2)

## 2022-09-01 LAB — TROPONIN I (HIGH SENSITIVITY): Troponin I (High Sensitivity): 2 ng/L (ref <18)

## 2022-09-01 MED ORDER — SODIUM CHLORIDE 0.9 % IV BOLUS
1000.0000 mL | Freq: Once | INTRAVENOUS | Status: AC
  Administered 2022-09-01: 1000 mL via INTRAVENOUS

## 2022-09-01 MED ORDER — NAPROXEN 500 MG PO TABS: 500.0000 mg | ORAL_TABLET | Freq: Two times a day (BID) | ORAL | 0 refills | Status: DC

## 2022-09-01 MED ORDER — KETOROLAC TROMETHAMINE 30 MG/ML IJ SOLN: 30.0000 mg | Freq: Once | INTRAMUSCULAR | Status: DC

## 2022-09-01 MED ORDER — METHOCARBAMOL 500 MG PO TABS: 500.0000 mg | ORAL_TABLET | Freq: Two times a day (BID) | ORAL | 0 refills | Status: DC | PRN

## 2022-09-01 MED ORDER — IOHEXOL 300 MG/ML  SOLN
100.0000 mL | Freq: Once | INTRAMUSCULAR | Status: AC | PRN
  Administered 2022-09-01: 75 mL via INTRAVENOUS

## 2022-09-01 MED ORDER — FENTANYL CITRATE PF 50 MCG/ML IJ SOSY
50.0000 ug | PREFILLED_SYRINGE | Freq: Once | INTRAMUSCULAR | Status: AC
  Administered 2022-09-01: 50 ug via INTRAVENOUS
  Filled 2022-09-01: qty 1

## 2022-09-01 NOTE — ED Triage Notes (Signed)
Pt presents to ED with complaints of left shoulder pain started yesterday. Feels like "something wants to burst". Pt states he was laying down when pain started yesterday.
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
## 2022-09-01 NOTE — Discharge Instructions (Addendum)
Your testing was normal - no signs of new problems  Please take Naprosyn, 500mg  by mouth twice daily as needed for pain - this in an antiinflammatory medicine (NSAID) and is similar to ibuprofen - many people feel that it is stronger than ibuprofen and it is easier to take since it is a smaller pill.  Please use this only for 1 week - if your pain persists, you will need to follow up with your doctor in the office for ongoing guidance and pain control.  Please take Robaxin, 500 mg up to 2 or 3 times a day as needed for muscle spasm, this is a muscle relaxer, it may cause generalized weakness, sleepiness and you should not drive or do important things while taking this medication.  This includes driving a vehicle or taking care of young children, these things should not be done while taking this medication.  [facility] Primary Care Doctor List    Tula Nakayama, MD. Specialty: [facility] Medicine Contact information: [address], [address] [place] [address]  [phone]   Sallee Lange, MD. Specialty: [facility] Medicine Contact information: [place]  [place] [address]  [phone]   Rosita Fire, MD Specialty: Internal Medicine Contact information: [place] [address]  [phone]   Delphina Cahill, MD. Specialty: Internal Medicine Contact information: [place] [address]  [phone]    [facility] (Dr. Maudie Mercury) Specialty: Family Medicine Contact information: [place] [address]  [phone]   Leslie Andrea, MD. Specialty: [facility] Contact information: [place] [address]  [phone]   Asencion Noble, MD. Specialty: Internal Medicine Contact information: [address] [address]  [place] [address]  [place]  [address] [place], [place] [address] [phone]  Services [employer] offers a variety of basic health services.  Services include but are not limited to: Blood pressure checks  Heart rate checks  Blood sugar checks  Urine analysis  Rapid strep tests  Pregnancy tests.  Health education and referrals  People needing more complex services will be directed to a physician online. Using these virtual visits, doctors can evaluate and prescribe medicine and treatments. There will be no medication on-site, though [facility] will help patients fill their prescriptions at little to no cost.   For More information please go to: [email or web address]

## 2022-10-13 ENCOUNTER — Emergency Department (HOSPITAL_COMMUNITY)
Admission: EM | Admit: 2022-10-13 | Discharge: 2022-10-13 | Disposition: A | Discharge status: Home / Self Care | Payer: Medicaid Other | Attending: Emergency Medicine | Admitting: Emergency Medicine

## 2022-10-13 ENCOUNTER — Encounter (HOSPITAL_COMMUNITY): Payer: Self-pay | Admitting: *Deleted

## 2022-10-13 ENCOUNTER — Emergency Department (HOSPITAL_COMMUNITY): Payer: Medicaid Other

## 2022-10-13 ENCOUNTER — Other Ambulatory Visit: Payer: Self-pay

## 2022-10-13 DIAGNOSIS — Z794 Long term (current) use of insulin: Secondary | ICD-10-CM | POA: Insufficient documentation

## 2022-10-13 DIAGNOSIS — Z1152 Encounter for screening for COVID-19: Secondary | ICD-10-CM | POA: Diagnosis not present

## 2022-10-13 DIAGNOSIS — R0981 Nasal congestion: Secondary | ICD-10-CM | POA: Diagnosis present

## 2022-10-13 DIAGNOSIS — J069 Acute upper respiratory infection, unspecified: Secondary | ICD-10-CM | POA: Diagnosis not present

## 2022-10-13 LAB — RESP PANEL BY RT-PCR (FLU A&B, COVID) ARPGX2
Influenza A by PCR: NEGATIVE
Influenza B by PCR: NEGATIVE
SARS Coronavirus 2 by RT PCR: NEGATIVE

## 2022-10-13 MED ORDER — DOXYCYCLINE HYCLATE 100 MG PO CAPS: 100.0000 mg | ORAL_CAPSULE | Freq: Two times a day (BID) | ORAL | 0 refills | Status: DC

## 2022-10-13 NOTE — ED Provider Notes (Signed)
[facility] EMERGENCY DEPARTMENT Provider Note   CSN: [id number] Arrival date & time: 10/13/22  1618     History {Add pertinent medical, surgical, social history, OB history to HPI:1} Chief Complaint  Patient presents with   Nasal Congestion    Eric Vega is a 61 y.o. male.  Patient has had mild cough and nasal congestion.  He was seen and had chest x-ray done and there was some questionable pneumothorax on chest x-ray.   Cough      Home Medications Prior to Admission medications   Medication Sig Start Date End Date Taking? Authorizing Provider  doxycycline (VIBRAMYCIN) 100 MG capsule Take 1 capsule (100 mg total) by mouth 2 (two) times daily. One po bid x 7 days 10/13/22  Yes Milton Ferguson, MD  ferrous sulfate 325 (65 FE) MG tablet Take 1 tablet (325 mg total) by mouth daily with breakfast. 06/07/22 07/07/22  Shahmehdi, Valeria Batman, MD  guaiFENesin-dextromethorphan (ROBITUSSIN DM) 100-10 MG/5ML syrup Take 10 mLs by mouth every 6 (six) hours. 06/06/22   Shahmehdi, Valeria Batman, MD  insulin aspart (NOVOLOG) 100 UNIT/ML injection Inject 0-9 Units into the skin 3 (three) times daily with meals. Patient taking differently: Inject 0-9 Units into the skin 2 (two) times daily with a meal. 06/06/22   Shahmehdi, Valeria Batman, MD  metFORMIN (GLUCOPHAGE) 500 MG tablet Take 1 tablet (500 mg total) by mouth daily with breakfast. 06/07/22 10/05/22  Shahmehdi, Valeria Batman, MD  methocarbamol (ROBAXIN) 500 MG tablet Take 1 tablet (500 mg total) by mouth 2 (two) times daily as needed for muscle spasms. 09/01/22   Noemi Chapel, MD  methylPREDNISolone (MEDROL DOSEPAK) 4 MG TBPK tablet Medrol Dosepak take as instructed 06/06/22   Deatra James, MD  naproxen (NAPROSYN) 500 MG tablet Take 1 tablet (500 mg total) by mouth 2 (two) times daily with a meal. 09/01/22   Noemi Chapel, MD      Allergies    Patient has no known allergies.    Review of Systems   Review of Systems  Respiratory:  Positive for cough.      Physical Exam Updated Vital Signs BP (!) 171/91 (BP Location: Left Arm)   Pulse 91   Temp 97.7 F (36.5 C) (Oral)   Resp 18   SpO2 94%  Physical Exam  ED Results / Procedures / Treatments   Labs (all labs ordered are listed, but only abnormal results are displayed) Labs Reviewed  RESP PANEL BY RT-PCR (FLU A&B, COVID) [id number]    EKG None  Radiology DG Chest 2 View  Result Date: 10/13/2022 CLINICAL DATA:  Shortness of breath and congestion. EXAM: CHEST - 2 VIEW COMPARISON:  Chest x-ray 06/03/2022.  Chest CT 09/01/2022 FINDINGS: Bilateral healed rib fractures and orthopedic hardware in the left ribs appear unchanged. There is no lung consolidation, pleural effusion or pneumothorax. Cardiomediastinal silhouette is within normal limits. Chronic deformity of the left scapula is also unchanged. IMPRESSION: No active cardiopulmonary disease. Electronically Signed   By: Ronney Asters M.D.   On: 10/13/2022 16:55    Procedures Procedures  {Document cardiac monitor, telemetry assessment procedure when appropriate:1}  Medications Ordered in ED Medications - No data to display  ED Course/ Medical Decision Making/ A&P                           Medical Decision Making Amount and/or Complexity of Data Reviewed Radiology: ordered.  Risk Prescription drug management.  Patient with cough  and nasal congestion.  Chest x-ray here does not show pneumothorax.  He will be placed on doxycycline for respiratory infection and follow-up as needed  {Document critical care time when appropriate:1} {Document review of labs and clinical decision tools ie heart score, Chads2Vasc2 etc:1}  {Document your independent review of radiology images, and any outside records:1} {Document your discussion with family members, caretakers, and with consultants:1} {Document social determinants of health affecting pt's care:1} {Document your decision making why or why not admission, treatments were  needed:1} Final Clinical Impression(s) / ED Diagnoses Final diagnoses:  URI, acute    Rx / DC Orders ED Discharge Orders          Ordered    doxycycline (VIBRAMYCIN) 100 MG capsule  2 times daily        10/13/22 1734

## 2022-10-13 NOTE — Discharge Instructions (Signed)
Follow up next week if not improving. °

## 2022-10-13 NOTE — ED Triage Notes (Signed)
Pt c/o nasal congestion since Saturday; pt was seen at his PCP and told to come to ED for xray

## 2022-10-13 NOTE — ED Notes (Signed)
Spanish speaking Interpretor used for language barrier

## 2022-11-13 ENCOUNTER — Other Ambulatory Visit: Payer: Self-pay

## 2022-11-13 ENCOUNTER — Emergency Department (HOSPITAL_COMMUNITY): Payer: Medicaid Other

## 2022-11-13 ENCOUNTER — Inpatient Hospital Stay (HOSPITAL_COMMUNITY)
Admission: EM | Admit: 2022-11-13 | Discharge: 2022-11-17 | DRG: 872 | Disposition: A | Discharge status: Home / Self Care | Payer: Medicaid Other | Attending: Internal Medicine | Admitting: Internal Medicine

## 2022-11-13 ENCOUNTER — Encounter (HOSPITAL_COMMUNITY): Payer: Self-pay | Admitting: *Deleted

## 2022-11-13 DIAGNOSIS — Z1152 Encounter for screening for COVID-19: Secondary | ICD-10-CM

## 2022-11-13 DIAGNOSIS — E46 Unspecified protein-calorie malnutrition: Secondary | ICD-10-CM | POA: Insufficient documentation

## 2022-11-13 DIAGNOSIS — Z681 Body mass index (BMI) 19 or less, adult: Secondary | ICD-10-CM

## 2022-11-13 DIAGNOSIS — A4181 Sepsis due to Enterococcus: Principal | ICD-10-CM | POA: Insufficient documentation

## 2022-11-13 DIAGNOSIS — E441 Mild protein-calorie malnutrition: Secondary | ICD-10-CM | POA: Diagnosis present

## 2022-11-13 DIAGNOSIS — R6510 Systemic inflammatory response syndrome (SIRS) of non-infectious origin without acute organ dysfunction: Secondary | ICD-10-CM | POA: Insufficient documentation

## 2022-11-13 DIAGNOSIS — I959 Hypotension, unspecified: Secondary | ICD-10-CM | POA: Diagnosis present

## 2022-11-13 DIAGNOSIS — T465X6A Underdosing of other antihypertensive drugs, initial encounter: Secondary | ICD-10-CM | POA: Diagnosis present

## 2022-11-13 DIAGNOSIS — I10 Essential (primary) hypertension: Secondary | ICD-10-CM | POA: Diagnosis present

## 2022-11-13 DIAGNOSIS — R1013 Epigastric pain: Secondary | ICD-10-CM | POA: Diagnosis present

## 2022-11-13 DIAGNOSIS — R7309 Other abnormal glucose: Secondary | ICD-10-CM

## 2022-11-13 DIAGNOSIS — Z91148 Patient's other noncompliance with medication regimen for other reason: Secondary | ICD-10-CM

## 2022-11-13 DIAGNOSIS — E1165 Type 2 diabetes mellitus with hyperglycemia: Secondary | ICD-10-CM | POA: Insufficient documentation

## 2022-11-13 DIAGNOSIS — A419 Sepsis, unspecified organism: Principal | ICD-10-CM

## 2022-11-13 DIAGNOSIS — N390 Urinary tract infection, site not specified: Secondary | ICD-10-CM | POA: Insufficient documentation

## 2022-11-13 DIAGNOSIS — B9729 Other coronavirus as the cause of diseases classified elsewhere: Secondary | ICD-10-CM | POA: Diagnosis present

## 2022-11-13 DIAGNOSIS — R051 Acute cough: Secondary | ICD-10-CM | POA: Diagnosis present

## 2022-11-13 DIAGNOSIS — D649 Anemia, unspecified: Secondary | ICD-10-CM | POA: Insufficient documentation

## 2022-11-13 DIAGNOSIS — E8809 Other disorders of plasma-protein metabolism, not elsewhere classified: Secondary | ICD-10-CM | POA: Insufficient documentation

## 2022-11-13 DIAGNOSIS — T383X6A Underdosing of insulin and oral hypoglycemic [antidiabetic] drugs, initial encounter: Secondary | ICD-10-CM | POA: Diagnosis present

## 2022-11-13 DIAGNOSIS — Z79899 Other long term (current) drug therapy: Secondary | ICD-10-CM

## 2022-11-13 DIAGNOSIS — R509 Fever, unspecified: Secondary | ICD-10-CM

## 2022-11-13 LAB — COMPREHENSIVE METABOLIC PANEL
ALT: 27 U/L (ref 0–44)
AST: 24 U/L (ref 15–41)
Albumin: 3.3 g/dL — ABNORMAL LOW (ref 3.5–5.0)
Alkaline Phosphatase: 81 U/L (ref 38–126)
Anion gap: 6 (ref 5–15)
BUN: 11 mg/dL (ref 6–20)
CO2: 27 mmol/L (ref 22–32)
Calcium: 8.1 mg/dL — ABNORMAL LOW (ref 8.9–10.3)
Chloride: 102 mmol/L (ref 98–111)
Creatinine, Ser: 0.83 mg/dL (ref 0.61–1.24)
GFR, Estimated: >60 mL/min (ref >60)
Glucose, Bld: 160 mg/dL — ABNORMAL HIGH (ref 70–99)
Potassium: 3.8 mmol/L (ref 3.5–5.1)
Sodium: 135 mmol/L (ref 135–145)
Total Bilirubin: 0.7 mg/dL (ref 0.3–1.2)
Total Protein: 6.3 g/dL — ABNORMAL LOW (ref 6.5–8.1)

## 2022-11-13 LAB — CBC WITH DIFFERENTIAL/PLATELET
Abs Immature Granulocytes: 0.02 10*3/uL (ref 0.00–0.07)
Basophils Absolute: 0 10*3/uL (ref 0.0–0.1)
Basophils Relative: 0 %
Eosinophils Absolute: 0.4 10*3/uL (ref 0.0–0.5)
Eosinophils Relative: 4 %
HCT: 33.2 % — ABNORMAL LOW (ref 39.0–52.0)
Hemoglobin: 11.1 g/dL — ABNORMAL LOW (ref 13.0–17.0)
Immature Granulocytes: 0 %
Lymphocytes Relative: 22 %
Lymphs Abs: 2 10*3/uL (ref 0.7–4.0)
MCH: 29.8 pg (ref 26.0–34.0)
MCHC: 33.4 g/dL (ref 30.0–36.0)
MCV: 89 fL (ref 80.0–100.0)
Monocytes Absolute: 0.7 10*3/uL (ref 0.1–1.0)
Monocytes Relative: 8 %
Neutro Abs: 6 10*3/uL (ref 1.7–7.7)
Neutrophils Relative %: 66 %
Platelets: 176 10*3/uL (ref 150–400)
RBC: 3.73 MIL/uL — ABNORMAL LOW (ref 4.22–5.81)
RDW: 13.9 % (ref 11.5–15.5)
WBC: 9.1 10*3/uL (ref 4.0–10.5)
nRBC: 0 % (ref 0.0–0.2)

## 2022-11-13 LAB — RESP PANEL BY RT-PCR (FLU A&B, COVID) ARPGX2
Influenza A by PCR: NEGATIVE
Influenza B by PCR: NEGATIVE
SARS Coronavirus 2 by RT PCR: NEGATIVE

## 2022-11-13 LAB — LIPASE, BLOOD: Lipase: 25 U/L (ref 11–51)

## 2022-11-13 LAB — PROTIME-INR
INR: 1.1 (ref 0.8–1.2)
Prothrombin Time: 13.9 seconds (ref 11.4–15.2)

## 2022-11-13 LAB — APTT: aPTT: 42 seconds — ABNORMAL HIGH (ref 24–36)

## 2022-11-13 LAB — LACTIC ACID, PLASMA
Lactic Acid, Venous: 1.5 mmol/L (ref 0.5–1.9)
Lactic Acid, Venous: 0.7 mmol/L (ref 0.5–1.9)

## 2022-11-13 MED ORDER — ACETAMINOPHEN 500 MG PO TABS
1000.0000 mg | ORAL_TABLET | Freq: Once | ORAL | Status: AC
  Administered 2022-11-13: 1000 mg via ORAL
  Filled 2022-11-13: qty 2

## 2022-11-13 MED ORDER — IOHEXOL 300 MG/ML  SOLN
100.0000 mL | Freq: Once | INTRAMUSCULAR | Status: AC | PRN
  Administered 2022-11-13: 100 mL via INTRAVENOUS

## 2022-11-13 MED ORDER — LACTATED RINGERS IV BOLUS
1000.0000 mL | Freq: Once | INTRAVENOUS | Status: AC
  Administered 2022-11-13: 1000 mL via INTRAVENOUS

## 2022-11-13 NOTE — ED Triage Notes (Signed)
Pt with productive cough of yellow phlegm x 3 days.  Pt with fever in triage. Pt speaks limited english.  C/o upper back pain and mid upper abd pain.  + nausea

## 2022-11-13 NOTE — ED Provider Notes (Incomplete)
Care assumed from [facility], patient with fever and SIRS criteria, no obvious source of infection. He is pending UA, CT chest/abdomen/pelvis. ANtibiotics have not been ordered.

## 2022-11-13 NOTE — ED Notes (Signed)
Pt unable to give urine sample at this time. Gave pt urinal.

## 2022-11-13 NOTE — ED Notes (Signed)
ED Provider at bedside. 

## 2022-11-14 DIAGNOSIS — E441 Mild protein-calorie malnutrition: Secondary | ICD-10-CM | POA: Diagnosis present

## 2022-11-14 DIAGNOSIS — Z681 Body mass index (BMI) 19 or less, adult: Secondary | ICD-10-CM | POA: Diagnosis not present

## 2022-11-14 DIAGNOSIS — R6510 Systemic inflammatory response syndrome (SIRS) of non-infectious origin without acute organ dysfunction: Secondary | ICD-10-CM | POA: Diagnosis present

## 2022-11-14 DIAGNOSIS — T383X6A Underdosing of insulin and oral hypoglycemic [antidiabetic] drugs, initial encounter: Secondary | ICD-10-CM | POA: Diagnosis present

## 2022-11-14 DIAGNOSIS — R509 Fever, unspecified: Secondary | ICD-10-CM

## 2022-11-14 DIAGNOSIS — D649 Anemia, unspecified: Secondary | ICD-10-CM

## 2022-11-14 DIAGNOSIS — I959 Hypotension, unspecified: Secondary | ICD-10-CM | POA: Diagnosis present

## 2022-11-14 DIAGNOSIS — A419 Sepsis, unspecified organism: Secondary | ICD-10-CM | POA: Diagnosis not present

## 2022-11-14 DIAGNOSIS — I10 Essential (primary) hypertension: Secondary | ICD-10-CM | POA: Diagnosis present

## 2022-11-14 DIAGNOSIS — E1165 Type 2 diabetes mellitus with hyperglycemia: Secondary | ICD-10-CM | POA: Diagnosis present

## 2022-11-14 DIAGNOSIS — R051 Acute cough: Secondary | ICD-10-CM | POA: Diagnosis present

## 2022-11-14 DIAGNOSIS — A4181 Sepsis due to Enterococcus: Secondary | ICD-10-CM | POA: Diagnosis present

## 2022-11-14 DIAGNOSIS — E8809 Other disorders of plasma-protein metabolism, not elsewhere classified: Secondary | ICD-10-CM

## 2022-11-14 DIAGNOSIS — R1013 Epigastric pain: Secondary | ICD-10-CM | POA: Diagnosis present

## 2022-11-14 DIAGNOSIS — E46 Unspecified protein-calorie malnutrition: Secondary | ICD-10-CM

## 2022-11-14 DIAGNOSIS — Z1152 Encounter for screening for COVID-19: Secondary | ICD-10-CM | POA: Diagnosis not present

## 2022-11-14 DIAGNOSIS — Z79899 Other long term (current) drug therapy: Secondary | ICD-10-CM | POA: Diagnosis not present

## 2022-11-14 DIAGNOSIS — N3001 Acute cystitis with hematuria: Secondary | ICD-10-CM | POA: Diagnosis not present

## 2022-11-14 DIAGNOSIS — N390 Urinary tract infection, site not specified: Secondary | ICD-10-CM | POA: Diagnosis present

## 2022-11-14 DIAGNOSIS — B9729 Other coronavirus as the cause of diseases classified elsewhere: Secondary | ICD-10-CM | POA: Diagnosis present

## 2022-11-14 DIAGNOSIS — Z91148 Patient's other noncompliance with medication regimen for other reason: Secondary | ICD-10-CM | POA: Diagnosis not present

## 2022-11-14 DIAGNOSIS — T465X6A Underdosing of other antihypertensive drugs, initial encounter: Secondary | ICD-10-CM | POA: Diagnosis present

## 2022-11-14 HISTORY — DX: Anemia, unspecified: D64.9

## 2022-11-14 HISTORY — DX: Type 2 diabetes mellitus with hyperglycemia: E11.65

## 2022-11-14 HISTORY — DX: Unspecified protein-calorie malnutrition: E46

## 2022-11-14 HISTORY — DX: Other disorders of plasma-protein metabolism, not elsewhere classified: E88.09

## 2022-11-14 HISTORY — DX: Systemic inflammatory response syndrome (sirs) of non-infectious origin without acute organ dysfunction: R65.10

## 2022-11-14 HISTORY — DX: Fever, unspecified: R50.9

## 2022-11-14 LAB — URINALYSIS, ROUTINE W REFLEX MICROSCOPIC
Bacteria, UA: NONE SEEN
Bilirubin Urine: NEGATIVE
Glucose, UA: NEGATIVE mg/dL
Ketones, ur: NEGATIVE mg/dL
Nitrite: NEGATIVE
Protein, ur: NEGATIVE mg/dL
Specific Gravity, Urine: 1.027 (ref 1.005–1.030)
pH: 5 (ref 5.0–8.0)

## 2022-11-14 LAB — GLUCOSE, CAPILLARY
Glucose-Capillary: 90 mg/dL (ref 70–99)
Glucose-Capillary: 153 mg/dL — ABNORMAL HIGH (ref 70–99)
Glucose-Capillary: 40 mg/dL — CL (ref 70–99)
Glucose-Capillary: 54 mg/dL — ABNORMAL LOW (ref 70–99)
Glucose-Capillary: 127 mg/dL — ABNORMAL HIGH (ref 70–99)

## 2022-11-14 LAB — CBC
HCT: 32.8 % — ABNORMAL LOW (ref 39.0–52.0)
Hemoglobin: 10.8 g/dL — ABNORMAL LOW (ref 13.0–17.0)
MCH: 29.2 pg (ref 26.0–34.0)
MCHC: 32.9 g/dL (ref 30.0–36.0)
MCV: 88.6 fL (ref 80.0–100.0)
Platelets: 165 10*3/uL (ref 150–400)
RBC: 3.7 MIL/uL — ABNORMAL LOW (ref 4.22–5.81)
RDW: 13.8 % (ref 11.5–15.5)
WBC: 7.2 10*3/uL (ref 4.0–10.5)
nRBC: 0 % (ref 0.0–0.2)

## 2022-11-14 LAB — PHOSPHORUS: Phosphorus: 3.2 mg/dL (ref 2.5–4.6)

## 2022-11-14 LAB — COMPREHENSIVE METABOLIC PANEL
ALT: 22 U/L (ref 0–44)
AST: 17 U/L (ref 15–41)
Albumin: 3 g/dL — ABNORMAL LOW (ref 3.5–5.0)
Alkaline Phosphatase: 69 U/L (ref 38–126)
Anion gap: 5 (ref 5–15)
BUN: 9 mg/dL (ref 6–20)
CO2: 29 mmol/L (ref 22–32)
Calcium: 8.1 mg/dL — ABNORMAL LOW (ref 8.9–10.3)
Chloride: 104 mmol/L (ref 98–111)
Creatinine, Ser: 0.6 mg/dL — ABNORMAL LOW (ref 0.61–1.24)
GFR, Estimated: >60 mL/min (ref >60)
Glucose, Bld: 104 mg/dL — ABNORMAL HIGH (ref 70–99)
Potassium: 3.7 mmol/L (ref 3.5–5.1)
Sodium: 138 mmol/L (ref 135–145)
Total Bilirubin: 0.8 mg/dL (ref 0.3–1.2)
Total Protein: 5.9 g/dL — ABNORMAL LOW (ref 6.5–8.1)

## 2022-11-14 LAB — PROCALCITONIN: Procalcitonin: <0.1 ng/mL

## 2022-11-14 LAB — MAGNESIUM: Magnesium: 1.8 mg/dL (ref 1.7–2.4)

## 2022-11-14 MED ORDER — ACETAMINOPHEN 650 MG RE SUPP: 650.0000 mg | Freq: Four times a day (QID) | RECTAL | Status: DC | PRN

## 2022-11-14 MED ORDER — LACTATED RINGERS IV SOLN: INTRAVENOUS | Status: AC

## 2022-11-14 MED ORDER — SODIUM CHLORIDE 0.9 % IV SOLN
2.0000 g | Freq: Three times a day (TID) | INTRAVENOUS | Status: DC
  Administered 2022-11-14 – 2022-11-16 (×7): 2 g via INTRAVENOUS
  Filled 2022-11-14 (×7): qty 12.5

## 2022-11-14 MED ORDER — LACTATED RINGERS IV BOLUS (SEPSIS)
500.0000 mL | Freq: Once | INTRAVENOUS | Status: AC
  Administered 2022-11-14: 500 mL via INTRAVENOUS

## 2022-11-14 MED ORDER — ONDANSETRON HCL 4 MG/2ML IJ SOLN: 4.0000 mg | Freq: Four times a day (QID) | INTRAMUSCULAR | Status: DC | PRN

## 2022-11-14 MED ORDER — VANCOMYCIN HCL 1250 MG/250ML IV SOLN
1250.0000 mg | INTRAVENOUS | Status: DC
  Administered 2022-11-14 – 2022-11-16 (×3): 1250 mg via INTRAVENOUS
  Filled 2022-11-14 (×3): qty 250

## 2022-11-14 MED ORDER — VANCOMYCIN HCL IN DEXTROSE 1-5 GM/200ML-% IV SOLN
1000.0000 mg | Freq: Once | INTRAVENOUS | Status: AC
  Administered 2022-11-14: 1000 mg via INTRAVENOUS
  Filled 2022-11-14: qty 200

## 2022-11-14 MED ORDER — ENOXAPARIN SODIUM 40 MG/0.4ML IJ SOSY
40.0000 mg | PREFILLED_SYRINGE | INTRAMUSCULAR | Status: DC
  Administered 2022-11-14 – 2022-11-16 (×3): 40 mg via SUBCUTANEOUS
  Filled 2022-11-14 (×3): qty 0.4

## 2022-11-14 MED ORDER — METRONIDAZOLE 500 MG/100ML IV SOLN
500.0000 mg | Freq: Once | INTRAVENOUS | Status: AC
  Administered 2022-11-14: 500 mg via INTRAVENOUS
  Filled 2022-11-14: qty 100

## 2022-11-14 MED ORDER — GLUCERNA SHAKE PO LIQD
237.0000 mL | Freq: Three times a day (TID) | ORAL | Status: DC
  Administered 2022-11-14 – 2022-11-17 (×8): 237 mL via ORAL
  Filled 2022-11-14 (×5): qty 237

## 2022-11-14 MED ORDER — ONDANSETRON HCL 4 MG PO TABS: 4.0000 mg | ORAL_TABLET | Freq: Four times a day (QID) | ORAL | Status: DC | PRN

## 2022-11-14 MED ORDER — ACETAMINOPHEN 325 MG PO TABS: 650.0000 mg | ORAL_TABLET | Freq: Four times a day (QID) | ORAL | Status: DC | PRN

## 2022-11-14 MED ORDER — FERROUS SULFATE 325 (65 FE) MG PO TABS
325.0000 mg | ORAL_TABLET | Freq: Every day | ORAL | Status: DC
  Administered 2022-11-15 – 2022-11-17 (×3): 325 mg via ORAL
  Filled 2022-11-14 (×3): qty 1

## 2022-11-14 MED ORDER — INSULIN ASPART 100 UNIT/ML IJ SOLN
0.0000 [IU] | Freq: Three times a day (TID) | INTRAMUSCULAR | Status: DC
  Administered 2022-11-14 – 2022-11-15 (×2): 2 [IU] via SUBCUTANEOUS
  Administered 2022-11-16 – 2022-11-17 (×2): 1 [IU] via SUBCUTANEOUS

## 2022-11-14 MED ORDER — SODIUM CHLORIDE 0.9 % IV SOLN
2.0000 g | Freq: Once | INTRAVENOUS | Status: AC
  Administered 2022-11-14: 2 g via INTRAVENOUS
  Filled 2022-11-14: qty 12.5

## 2022-11-14 MED ORDER — ORAL CARE MOUTH RINSE: 15.0000 mL | OROMUCOSAL | Status: DC | PRN

## 2022-11-14 MED ORDER — LACTATED RINGERS IV BOLUS (SEPSIS)
250.0000 mL | Freq: Once | INTRAVENOUS | Status: AC
  Administered 2022-11-14: 250 mL via INTRAVENOUS

## 2022-11-14 NOTE — Progress Notes (Signed)
Hypoglycemic Event  CBG: 40  Treatment: 4 oz juice/soda  Symptoms: Hungry  Follow-up CBG: Time: 2120 CBG Result:54  Possible Reasons for Event: Inadequate meal intake  Comments/MD notified: Dr. Tresa Endo

## 2022-11-14 NOTE — Progress Notes (Signed)
Pharmacy Antibiotic Note  Eric Vega is a 61 y.o. male admitted on 11/13/2022 with sepsis.  Pharmacy has been consulted for Vancomycin/Cefepime dosing. WBC WNL. Renal function good. Hypotensive in the ED. CT chest/abd/pelvis with no obvious source of infection.   Plan: Vancomycin 1250 mg IV q24h >>>Estimated AUC: 463 Cefepime 2g IV q8h Trend WBC, temp, renal function  F/U infectious work-up Drug levels as indicated  Height: 5\' 5"  (165.1 cm) Weight: 51.3 kg (113 lb) IBW/kg (Calculated) : 61.5  Temp (24hrs), Avg:100.3 F (37.9 C), Min:98.3 F (36.8 C), Max:102.3 F (39.1 C)  Recent Labs  Lab 11/13/22 2055 11/13/22 2306  WBC 9.1  --   CREATININE 0.83  --   LATICACIDVEN 1.5 0.7    Estimated Creatinine Clearance: 69.5 mL/min (by C-G formula based on SCr of 0.83 mg/dL).    No Known Allergies  14/01/23, PharmD, BCPS Clinical Pharmacist Phone: [phone]

## 2022-11-14 NOTE — Progress Notes (Addendum)
PROGRESS NOTE  Eric Vega [id number] DOB: 26-Dec-1960 DOA: 11/13/2022 PCP: Shelby DubinBucio, [name], FNP  Brief History:  61 year old male with a history of diabetes mellitus type 2, hypertension presenting with 3-day history of fevers, chills, rigors, and myalgias.  The patient denied any headache, neck pain, hemoptysis, chest pain, shortness of breath.  He has had some nausea without any emesis or diarrhea.  He complains of some epigastric pain for the same period of time.  He states that he has also had a largely nonproductive cough without hemoptysis.  He denies any alcohol or illicit drug use.  He denies any NSAIDs.  He states that he previously took losartan and metformin, but he has been out of his medications for few weeks.  He states that his appetite has been okay.  He has not had any dysuria, hematuria, hematochezia, melena.  He denies any sick contacts or travels. In the ED, the patient was febrile up to 102.3 F and hypotensive with systolic blood pressure in the 80s.  Oxygen saturation was 99% on room air.  WBC 9.1, hemoglobin 9.1, platelets 176,000. Chest x-ray showed old rib fractures.  CT of the chest was negative for any infiltrates or edema but showed chronic left clavicle/rib/scapular deformities.  CT abdomen pelvis showed a geographic low-density in the right hepatic lobe likely geographic fatty infiltration.  Otherwise there was no other acute findings in the CT abdomen.  CT of the thoracic and lumbar spine were negative for any acute findings.  The patient was started on vancomycin, cefepime, metronidazole and IV fluids.   Assessment/Plan: SIRS -Workup in progress -COVID-19 PCR negative -Continue vancomycin and cefepime pending culture data -CT chest and chest x-ray negative for infiltrates -UA negative for significant pyuria -Follow blood and urine cultures -CT abdomen pelvis negative for any acute findings -Lactic acid peaked  1.5 -PCT<0.10  Hypertension -Holding losartan  Diabetes mellitus type 2 -06/03/2022 hemoglobin A1c 6.0 -Monitor CBGs -NovoLog sliding scale  Normocytic anemia -Patient previously took iron and B12 -Check B12 -Iron studies     Family Communication:  no  Family at bedside  Consultants:  none  Code Status:  FULL   DVT Prophylaxis:  [place] Lovenox   Procedures: As Listed in Progress Note Above  Antibiotics: Vanc 12/1>> Cefepime 12/1>>   Total time spent 50 minutes.  Greater than 50% spent face to face counseling and coordinating care.    Subjective: Patient denies fevers, chills, headache, chest pain, dyspnea, nausea, vomiting, diarrhea, abdominal pain, dysuria, hematuria, hematochezia, and melena.   Objective: Vitals:   11/14/22 0300 11/14/22 0448 11/14/22 0600 11/14/22 0800  BP: (!) 81/56 (!) 82/54 (!) 92/59 (!) 147/79  Pulse: (!) 58 (!) 52 (!) 52   Resp: 18 16 14 13   Temp:      TempSrc:      SpO2: 100% 96% 94%   Weight:      Height:        Intake/Output Summary (Last 24 hours) at 11/14/2022 0905 Last data filed at 11/14/2022 0237 Gross per 24 hour  Intake 1150 ml  Output --  Net 1150 ml   Weight change:  Exam:  General:  Pt is alert, follows commands appropriately, not in acute distress HEENT: No icterus, No thrush, No neck mass, [place]/AT Cardiovascular: RRR, S1/S2, no rubs, no gallops Respiratory: bibasilar rales. No wheeze Abdomen: Soft/+BS, non tender, non distended, no guarding Extremities: No edema, No lymphangitis, No petechiae, No rashes, no  synovitis   Data Reviewed: I have personally reviewed following labs and imaging studies Basic Metabolic Panel: Recent Labs  Lab 11/13/22 2055  NA 135  K 3.8  CL 102  CO2 27  GLUCOSE 160*  BUN 11  CREATININE 0.83  CALCIUM 8.1*   Liver Function Tests: Recent Labs  Lab 11/13/22 2055  AST 24  ALT 27  ALKPHOS 81  BILITOT 0.7  PROT 6.3*  ALBUMIN 3.3*   Recent Labs  Lab 11/13/22 2055   LIPASE 25   No results for input(s): "AMMONIA" in the last 168 hours. Coagulation Profile: Recent Labs  Lab 11/13/22 2055  INR 1.1   CBC: Recent Labs  Lab 11/13/22 2055  WBC 9.1  NEUTROABS 6.0  HGB 11.1*  HCT 33.2*  MCV 89.0  PLT 176   Cardiac Enzymes: No results for input(s): "CKTOTAL", "CKMB", "CKMBINDEX", "TROPONINI" in the last 168 hours. BNP: Invalid input(s): "POCBNP" CBG: No results for input(s): "GLUCAP" in the last 168 hours. HbA1C: No results for input(s): "HGBA1C" in the last 72 hours. Urine analysis:    Component Value Date/Time   COLORURINE YELLOW 11/14/2022 0230   APPEARANCEUR CLEAR 11/14/2022 0230   LABSPEC 1.027 11/14/2022 0230   PHURINE 5.0 11/14/2022 0230   GLUCOSEU NEGATIVE 11/14/2022 0230   HGBUR SMALL (A) 11/14/2022 0230   BILIRUBINUR NEGATIVE 11/14/2022 0230   KETONESUR NEGATIVE 11/14/2022 0230   PROTEINUR NEGATIVE 11/14/2022 0230   NITRITE NEGATIVE 11/14/2022 0230   LEUKOCYTESUR SMALL (A) 11/14/2022 0230   Sepsis Labs: @LABRCNTIP (procalcitonin:4,lacticidven:4) ) Recent Results (from the past 240 hour(s))  Blood Culture (routine x 2)     Status: None (Preliminary result)   Collection Time: 11/13/22  8:55 PM   Specimen: BLOOD RIGHT FOREARM  Result Value Ref Range Status   Specimen Description   Final    BLOOD RIGHT FOREARM BOTTLES DRAWN AEROBIC AND ANAEROBIC   Special Requests Blood Culture adequate volume  Final   Culture   Final    NO GROWTH < 12 HOURS Performed at [facility], [address], [place], [place]    Report Status PENDING  Incomplete  Resp Panel by RT-PCR (Flu A&B, Covid) Anterior Nasal Swab     Status: None   Collection Time: 11/13/22  8:55 PM   Specimen: Anterior Nasal Swab  Result Value Ref Range Status   SARS Coronavirus 2 by RT PCR NEGATIVE NEGATIVE Final    Comment: (NOTE) SARS-CoV-2 target nucleic acids are NOT DETECTED.  The SARS-CoV-2 RNA is generally detectable in upper respiratory specimens  during the acute phase of infection. The lowest concentration of SARS-CoV-2 viral copies this assay can detect is 138 copies/mL. A negative result does not preclude SARS-Cov-2 infection and should not be used as the sole basis for treatment or other patient management decisions. A negative result may occur with  improper specimen collection/handling, submission of specimen other than nasopharyngeal swab, presence of viral mutation(s) within the areas targeted by this assay, and inadequate number of viral copies(<138 copies/mL). A negative result must be combined with clinical observations, patient history, and epidemiological information. The expected result is Negative.  Fact Sheet for Patients:  14/01/23  Fact Sheet for Healthcare Providers:  BloggerCourse.com  This test is no t yet approved or cleared by the [place] FDA and  has been authorized for detection and/or diagnosis of SARS-CoV-2 by FDA under an Emergency Use Authorization (EUA). This EUA will remain  in effect (meaning this test can be used) for the duration  of the COVID-19 declaration under Section 564(b)(1) of the Act, 21 U.S.C.section 360bbb-3(b)(1), unless the authorization is terminated  or revoked sooner.       Influenza A by PCR NEGATIVE NEGATIVE Final   Influenza B by PCR NEGATIVE NEGATIVE Final    Comment: (NOTE) The Xpert Xpress SARS-CoV-2/FLU/RSV plus assay is intended as an aid in the diagnosis of influenza from Nasopharyngeal swab specimens and should not be used as a sole basis for treatment. Nasal washings and aspirates are unacceptable for Xpert Xpress SARS-CoV-2/FLU/RSV testing.  Fact Sheet for Patients: BloggerCourse.com  Fact Sheet for Healthcare Providers: SeriousBroker.it  This test is not yet approved or cleared by the [place] FDA and has been authorized for detection  and/or diagnosis of SARS-CoV-2 by FDA under an Emergency Use Authorization (EUA). This EUA will remain in effect (meaning this test can be used) for the duration of the COVID-19 declaration under Section 564(b)(1) of the Act, 21 U.S.C. section 360bbb-3(b)(1), unless the authorization is terminated or revoked.  Performed at [facility], [address] [place], [place] [address]   Blood Culture (routine x 2)     Status: None (Preliminary result)   Collection Time: 11/13/22  9:41 PM   Specimen: Left Antecubital; Blood  Result Value Ref Range Status   Specimen Description   Final    LEFT ANTECUBITAL BOTTLES DRAWN AEROBIC AND ANAEROBIC   Special Requests Blood Culture adequate volume  Final   Culture   Final    NO GROWTH < 12 HOURS Performed at [facility], [address], [place], [place] [address]    Report Status PENDING  Incomplete     Scheduled Meds:  feeding supplement (GLUCERNA SHAKE)  237 mL Oral TID BM   insulin aspart  0-9 Units Subcutaneous TID WC   Continuous Infusions:  ceFEPime (MAXIPIME) IV 2 g (11/14/22 0655)   lactated ringers 150 mL/hr at 11/14/22 0024   vancomycin      Procedures/Studies: CT T-SPINE NO CHARGE  Result Date: 11/14/2022 CLINICAL DATA:  Mid and upper back pain EXAM: CT THORACIC AND LUMBAR SPINE WITHOUT CONTRAST TECHNIQUE: Multidetector CT imaging of the thoracic and lumbar spine was performed without contrast. Multiplanar CT image reconstructions were also generated. RADIATION DOSE REDUCTION: This exam was performed according to the departmental dose-optimization program which includes automated exposure control, adjustment of the mA and/or kV according to patient size and/or use of iterative reconstruction technique. COMPARISON:  None Available. FINDINGS: CT THORACIC SPINE FINDINGS Alignment: Normal. Vertebrae: No acute fracture or focal pathologic process. Disc levels: No spinal canal stenosis. CT LUMBAR SPINE FINDINGS Segmentation: 5 lumbar type  vertebrae. Alignment: Normal. Vertebrae: No acute fracture or focal pathologic process. Disc levels: No spinal canal stenosis IMPRESSION: 1. No acute abnormality of the thoracic or lumbar spine. Electronically Signed   By: Deatra Robinson M.D.   On: 11/14/2022 00:05   CT L-SPINE NO CHARGE  Result Date: 11/14/2022 CLINICAL DATA:  Mid and upper back pain EXAM: CT THORACIC AND LUMBAR SPINE WITHOUT CONTRAST TECHNIQUE: Multidetector CT imaging of the thoracic and lumbar spine was performed without contrast. Multiplanar CT image reconstructions were also generated. RADIATION DOSE REDUCTION: This exam was performed according to the departmental dose-optimization program which includes automated exposure control, adjustment of the mA and/or kV according to patient size and/or use of iterative reconstruction technique. COMPARISON:  None Available. FINDINGS: CT THORACIC SPINE FINDINGS Alignment: Normal. Vertebrae: No acute fracture or focal pathologic process. Disc levels: No spinal canal stenosis. CT LUMBAR  SPINE FINDINGS Segmentation: 5 lumbar type vertebrae. Alignment: Normal. Vertebrae: No acute fracture or focal pathologic process. Disc levels: No spinal canal stenosis IMPRESSION: 1. No acute abnormality of the thoracic or lumbar spine. Electronically Signed   By: Deatra Robinson M.D.   On: 11/14/2022 00:05   CT CHEST ABDOMEN PELVIS W CONTRAST  Result Date: 11/13/2022 CLINICAL DATA:  Sepsis tachypnea, diminished lung sounds, epigastric abdominal pain and diffuse midline back pain. Productive cough. EXAM: CT CHEST, ABDOMEN, AND PELVIS WITH CONTRAST TECHNIQUE: Multidetector CT imaging of the chest, abdomen and pelvis was performed following the standard protocol during bolus administration of intravenous contrast. RADIATION DOSE REDUCTION: This exam was performed according to the departmental dose-optimization program which includes automated exposure control, adjustment of the mA and/or kV according to patient size  and/or use of iterative reconstruction technique. CONTRAST:  OMNIPAQUE IOHEXOL 300 MG/ML  SOLN COMPARISON:  09/01/2022 FINDINGS: CT CHEST FINDINGS Cardiovascular: Heart is normal size. Aorta is normal caliber. Scattered coronary artery and aortic calcifications. Mediastinum/Nodes: No mediastinal, hilar, or axillary adenopathy. Trachea and esophagus are unremarkable. Thyroid unremarkable. Lungs/Pleura: Lungs are clear. No focal airspace opacities or suspicious nodules. No effusions. Musculoskeletal: Chest wall soft tissues are unremarkable. Chronic deformities of the left clavicle and ribs as well as left scapula. Prior plating of the left ribs. Old right rib fractures. Findings are stable since prior study. No acute bony abnormality. CT ABDOMEN PELVIS FINDINGS Hepatobiliary: Geographic low-density posteriorly in the right hepatic lobe, likely geographic fatty infiltration or differential perfusion. Gallbladder unremarkable. Pancreas: No focal abnormality or ductal dilatation. Spleen: No focal abnormality.  Normal size. Adrenals/Urinary Tract: No adrenal abnormality. No focal renal abnormality. No stones or hydronephrosis. Urinary bladder is unremarkable. Stomach/Bowel: Normal appendix. Stomach, large and small bowel grossly unremarkable. Vascular/Lymphatic: Aortic atherosclerosis. No evidence of aneurysm or adenopathy. Reproductive: No visible focal abnormality. Other: No free fluid or free air. Musculoskeletal: No acute bony abnormality. IMPRESSION: No acute findings in the chest, abdomen or pelvis. Geographic low-density in the posterior right hepatic lobe likely reflects geographic fatty infiltration or differential perfusion. Scattered coronary artery disease, aortic atherosclerosis. Electronically Signed   By: Charlett Nose M.D.   On: 11/13/2022 23:48   DG Chest Portable 1 View  Result Date: 11/13/2022 CLINICAL DATA:  Productive cough and fever. EXAM: PORTABLE CHEST 1 VIEW COMPARISON:  October 13, 2022  FINDINGS: The heart size and mediastinal contours are within normal limits. There is no evidence of an acute infiltrate, pleural effusion or pneumothorax. Numerous chronic bilateral rib fractures are seen with multiple associated radiopaque fixation plates and screws in place on the left. A chronic deformity is seen involving the left scapula. IMPRESSION: Stable exam without evidence of acute or active cardiopulmonary disease. Electronically Signed   By: Aram Candela M.D.   On: 11/13/2022 20:57    Catarina Hartshorn, DO  [facility]  If 7PM-7AM, please contact night-coverage [email or web address] Password [email or web address] 11/14/2022, 9:05 AM   LOS: 0 days

## 2022-11-14 NOTE — ED Provider Notes (Incomplete)
[facility] EMERGENCY DEPARTMENT Provider Note   CSN: [id number] Arrival date & time: 11/13/22  2012     History Chief Complaint  Patient presents with  . Cough    Fever noted in triage    Eric Vega is a 61 y.o. male.   Cough      Home Medications Prior to Admission medications   Medication Sig Start Date End Date Taking? Authorizing Provider  doxycycline (VIBRAMYCIN) 100 MG capsule Take 1 capsule (100 mg total) by mouth 2 (two) times daily. One po bid x 7 days 10/13/22   Bethann Berkshire, MD  ferrous sulfate 325 (65 FE) MG tablet Take 1 tablet (325 mg total) by mouth daily with breakfast. 06/07/22 07/07/22  Shahmehdi, Gemma Payor, MD  guaiFENesin-dextromethorphan (ROBITUSSIN DM) 100-10 MG/5ML syrup Take 10 mLs by mouth every 6 (six) hours. 06/06/22   Shahmehdi, Gemma Payor, MD  insulin aspart (NOVOLOG) 100 UNIT/ML injection Inject 0-9 Units into the skin 3 (three) times daily with meals. Patient taking differently: Inject 0-9 Units into the skin 2 (two) times daily with a meal. 06/06/22   Shahmehdi, Gemma Payor, MD  metFORMIN (GLUCOPHAGE) 500 MG tablet Take 1 tablet (500 mg total) by mouth daily with breakfast. 06/07/22 10/05/22  Shahmehdi, Gemma Payor, MD  methocarbamol (ROBAXIN) 500 MG tablet Take 1 tablet (500 mg total) by mouth 2 (two) times daily as needed for muscle spasms. 09/01/22   Eber Hong, MD  methylPREDNISolone (MEDROL DOSEPAK) 4 MG TBPK tablet Medrol Dosepak take as instructed 06/06/22   Kendell Bane, MD  naproxen (NAPROSYN) 500 MG tablet Take 1 tablet (500 mg total) by mouth 2 (two) times daily with a meal. 09/01/22   Eber Hong, MD      Allergies    Patient has no known allergies.    Review of Systems   Review of Systems  Respiratory:  Positive for cough.     Physical Exam Updated Vital Signs BP (!) 93/57   Pulse 93   Temp (!) 102.3 F (39.1 C) (Oral)   Resp 17   Ht 5\' 5"  (1.651 m)   Wt 51.3 kg   SpO2 100%   BMI 18.80 kg/m  Physical Exam  ED  Results / Procedures / Treatments   Labs (all labs ordered are listed, but only abnormal results are displayed) Labs Reviewed  COMPREHENSIVE METABOLIC PANEL - Abnormal; Notable for the following components:      Result Value   Glucose, Bld 160 (*)    Calcium 8.1 (*)    Total Protein 6.3 (*)    Albumin 3.3 (*)    All other components within normal limits  CBC WITH DIFFERENTIAL/PLATELET - Abnormal; Notable for the following components:   RBC 3.73 (*)    Hemoglobin 11.1 (*)    HCT 33.2 (*)    All other components within normal limits  APTT - Abnormal; Notable for the following components:   aPTT 42 (*)    All other components within normal limits  RESP PANEL BY RT-PCR (FLU A&B, COVID) ARPGX2  CULTURE, BLOOD (ROUTINE X 2)  CULTURE, BLOOD (ROUTINE X 2)  URINE CULTURE  LACTIC ACID, PLASMA  LACTIC ACID, PLASMA  PROTIME-INR  LIPASE, BLOOD  URINALYSIS, ROUTINE W REFLEX MICROSCOPIC    EKG EKG Interpretation  Date/Time:  Friday November 13 2022 20:55:19 EST Ventricular Rate:  98 PR Interval:  209 QRS Duration: 146 QT Interval:  357 QTC Calculation: 456 R Axis:   -68 Text Interpretation: Sinus rhythm 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 EXAM: PORTABLE CHEST 1 VIEW COMPARISON:  October 13, 2022 FINDINGS: The heart size and mediastinal contours are within normal limits. There is no evidence of an acute infiltrate, pleural effusion or pneumothorax. Numerous chronic bilateral rib fractures are seen with multiple associated radiopaque fixation plates and screws in place on the left. A chronic deformity is seen involving the left scapula. IMPRESSION: Stable exam without evidence of acute or active cardiopulmonary disease. Electronically Signed   By: Aram Candela M.D.    On: 11/13/2022 20:57    Procedures Procedures   Medications Ordered in ED Medications  lactated ringers bolus 1,000 mL (1,000 mLs Intravenous Bolus 11/13/22 2133)  acetaminophen (TYLENOL) tablet 1,000 mg (1,000 mg Oral Given 11/13/22 2131)  iohexol (OMNIPAQUE) 300 MG/ML solution 100 mL (100 mLs Intravenous Contrast Given 11/13/22 2314)    ED Course/ Medical Decision Making/ A&P                           Medical Decision Making Amount and/or Complexity of Data Reviewed Labs: ordered. Radiology: ordered. ECG/medicine tests: ordered.  Risk OTC drugs. Prescription drug management.   12:04 AM Care of Venia Carbon transferred to Dr. Preston Fleeting at the end of my shift as the patient will require reassessment once labs/imaging have resulted. Patient presentation, ED course, and plan of care discussed with review of all pertinent labs and imaging. Please see his/her note for further details regarding further ED course and disposition. Plan at time of handoff is follow up on CT imaging. Patient is meeting SIRS criteria, likely admission. This may be altered or completely changed at the discretion of the oncoming team pending results of further workup.  Final Clinical Impression(s) / ED Diagnoses Final diagnoses:  None    Rx / DC Orders ED Discharge Orders     None

## 2022-11-14 NOTE — Progress Notes (Signed)
  Transition of Care [facility]) Screening Note   Patient Details  Name: Jaiquan Temme Date of Birth: [birth year]   Transition of Care [facility]) CM/SW Contact:    Villa Herb, LCSWA Phone Number: 11/14/2022, 11:57 AM    Transition of Care Department [facility]) has reviewed patient and no TOC needs have been identified at this time. We will continue to monitor patient advancement through interdisciplinary progression rounds. If new patient transition needs arise, please place a TOC consult.

## 2022-11-14 NOTE — Hospital Course (Addendum)
61 year old male with a history of diabetes mellitus type 2, hypertension presenting with 3-day history of fevers, chills, rigors, and myalgias.  The patient denied any headache, neck pain, hemoptysis, chest pain, shortness of breath.  He has had some nausea without any emesis or diarrhea.  He complains of some epigastric pain for the same period of time.  He states that he has also had a largely nonproductive cough without hemoptysis.  He denies any alcohol or illicit drug use.  He denies any NSAIDs.  He states that he previously took losartan and metformin, but he has been out of his medications for few weeks.  He states that his appetite has been okay.  He has not had any dysuria, hematuria, hematochezia, melena.  He denies any sick contacts or travels. In the ED, the patient was febrile up to 102.3 F and hypotensive with systolic blood pressure in the 80s.  Oxygen saturation was 99% on room air.  WBC 9.1, hemoglobin 9.1, platelets 176,000.  Lipase 25 Chest x-ray showed old rib fractures.  CT of the chest was negative for any infiltrates or edema but showed chronic left clavicle/rib/scapular deformities.  CT abdomen pelvis showed a geographic low-density in the right hepatic lobe likely geographic fatty infiltration.  Otherwise there was no other acute findings in the CT abdomen.  CT of the thoracic and lumbar spine were negative for any acute findings.  The patient was started on vancomycin, cefepime, metronidazole and IV fluids.

## 2022-11-14 NOTE — Sepsis Progress Note (Signed)
Following for sepsis monitoring ?

## 2022-11-14 NOTE — ED Provider Notes (Signed)
[facility] EMERGENCY DEPARTMENT Provider Note   CSN: [id number] Arrival date & time: 11/13/22  2012     History Chief Complaint  Patient presents with   Cough    Fever noted in triage    Eric Vega is a 61 y.o. male with h/o DM presents to the ER for evaluation of subjective fever, body aches (mainly in the back and bilateral legs, mild nausea with epigastric pain for the past three days.  He denies any chest pain, shortness of breath, cough, vomiting, abdominal pain.  Patient is actively coughing during this interview and is only able to speak a few sentences without intake of breath.  No medications tried prior to arrival.  Interpreter service was used during this encounter.    Cough Associated symptoms: fever (subjective) and myalgias   Associated symptoms: no chest pain and no shortness of breath        Home Medications Prior to Admission medications   Medication Sig Start Date End Date Taking? Authorizing Provider  doxycycline (VIBRAMYCIN) 100 MG capsule Take 1 capsule (100 mg total) by mouth 2 (two) times daily. One po bid x 7 days 10/13/22   Bethann Berkshire, MD  ferrous sulfate 325 (65 FE) MG tablet Take 1 tablet (325 mg total) by mouth daily with breakfast. 06/07/22 07/07/22  Shahmehdi, Gemma Payor, MD  guaiFENesin-dextromethorphan (ROBITUSSIN DM) 100-10 MG/5ML syrup Take 10 mLs by mouth every 6 (six) hours. 06/06/22   Shahmehdi, Gemma Payor, MD  insulin aspart (NOVOLOG) 100 UNIT/ML injection Inject 0-9 Units into the skin 3 (three) times daily with meals. Patient taking differently: Inject 0-9 Units into the skin 2 (two) times daily with a meal. 06/06/22   Shahmehdi, Gemma Payor, MD  metFORMIN (GLUCOPHAGE) 500 MG tablet Take 1 tablet (500 mg total) by mouth daily with breakfast. 06/07/22 10/05/22  Shahmehdi, Gemma Payor, MD  methocarbamol (ROBAXIN) 500 MG tablet Take 1 tablet (500 mg total) by mouth 2 (two) times daily as needed for muscle spasms. 09/01/22   Eber Hong, MD   methylPREDNISolone (MEDROL DOSEPAK) 4 MG TBPK tablet Medrol Dosepak take as instructed 06/06/22   Kendell Bane, MD  naproxen (NAPROSYN) 500 MG tablet Take 1 tablet (500 mg total) by mouth 2 (two) times daily with a meal. 09/01/22   Eber Hong, MD      Allergies    Patient has no known allergies.    Review of Systems   Review of Systems  Constitutional:  Positive for fever (subjective).  Respiratory:  Negative for cough and shortness of breath.   Cardiovascular:  Negative for chest pain.  Gastrointestinal:  Positive for abdominal pain and nausea. Negative for vomiting.  Musculoskeletal:  Positive for myalgias.    Physical Exam Updated Vital Signs BP (!) 93/57   Pulse 93   Temp (!) 102.3 F (39.1 C) (Oral)   Resp 17   Ht 5\' 5"  (1.651 m)   Wt 51.3 kg   SpO2 100%   BMI 18.80 kg/m  Physical Exam Vitals and nursing note reviewed.  Constitutional:      Appearance: Normal appearance. He is ill-appearing.  HENT:     Head: Normocephalic and atraumatic.  Eyes:     General: No scleral icterus. Neck:     Comments: Lower midline tenderness. No step off or deformities. No signs of trauma. No increase warm or erythema. Full ROM of neck. No nuchal rigidity.  Cardiovascular:     Rate and Rhythm: Regular rhythm. Tachycardia present.  Pulmonary:  Comments: Tachypnea 25-28, satting well on room air. The patient can only speak a few words and needs to take a breath. No tripoding or accessory muscle use.  Abdominal:     General: Bowel sounds are normal. There is no distension.     Palpations: Abdomen is soft.     Tenderness: There is abdominal tenderness. There is no right CVA tenderness, left CVA tenderness, guarding or rebound.     Comments: Very mild tenderness to palpation of the epigastric region. No overlying skin changes. Soft. NBS. No CVA tenderness.   Musculoskeletal:        General: No deformity.     Cervical back: Normal range of motion and neck supple. Tenderness  present. No rigidity.     Right lower leg: No edema.     Left lower leg: No edema.     Comments: Diffuse back tenderness to palpation, midline included from cervical to lumbar. Old scarring noted to the back. No increased warmth or erythema noted.   Skin:    General: Skin is warm and dry.  Neurological:     General: No focal deficit present.     Mental Status: He is alert. Mental status is at baseline.     ED Results / Procedures / Treatments   Labs (all labs ordered are listed, but only abnormal results are displayed) Labs Reviewed  COMPREHENSIVE METABOLIC PANEL - Abnormal; Notable for the following components:      Result Value   Glucose, Bld 160 (*)    Calcium 8.1 (*)    Total Protein 6.3 (*)    Albumin 3.3 (*)    All other components within normal limits  CBC WITH DIFFERENTIAL/PLATELET - Abnormal; Notable for the following components:   RBC 3.73 (*)    Hemoglobin 11.1 (*)    HCT 33.2 (*)    All other components within normal limits  APTT - Abnormal; Notable for the following components:   aPTT 42 (*)    All other components within normal limits  RESP PANEL BY RT-PCR (FLU A&B, COVID) ARPGX2  CULTURE, BLOOD (ROUTINE X 2)  CULTURE, BLOOD (ROUTINE X 2)  URINE CULTURE  LACTIC ACID, PLASMA  LACTIC ACID, PLASMA  PROTIME-INR  LIPASE, BLOOD  URINALYSIS, ROUTINE W REFLEX MICROSCOPIC    EKG EKG Interpretation  Date/Time:  Friday November 13 2022 20:55:19 EST Ventricular Rate:  98 PR Interval:  209 QRS Duration: 146 QT Interval:  357 QTC Calculation: 456 R Axis:   -68 Text Interpretation: Sinus rhythm 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 Multiplanar CT image reconstructions were also generated. RADIATION DOSE REDUCTION: This exam was performed according to the departmental dose-optimization program which includes automated exposure control, adjustment of the mA and/or kV according to patient size and/or use of iterative reconstruction technique. COMPARISON:  None Available. FINDINGS: CT THORACIC SPINE FINDINGS Alignment: Normal. Vertebrae: No acute fracture or focal pathologic process. Disc levels: No spinal canal stenosis. CT LUMBAR SPINE FINDINGS Segmentation: 5 lumbar type vertebrae. Alignment: Normal. Vertebrae: No acute fracture or focal pathologic process. Disc levels: No spinal canal stenosis IMPRESSION: 1. No acute abnormality of the thoracic or lumbar spine.  Electronically Signed   By: Ulyses Jarred M.D.   On: 11/14/2022 00:05   CT L-SPINE NO CHARGE  Result Date: 11/14/2022 CLINICAL DATA:  Mid and upper back pain EXAM: CT THORACIC AND LUMBAR SPINE WITHOUT CONTRAST TECHNIQUE: Multidetector CT imaging of the thoracic and lumbar spine was performed without contrast. Multiplanar CT image reconstructions were also generated. RADIATION DOSE REDUCTION: This exam was performed according to the departmental dose-optimization program which includes automated exposure control, adjustment of the mA and/or kV according to patient size and/or use of iterative reconstruction technique. COMPARISON:  None Available. FINDINGS: CT THORACIC SPINE FINDINGS Alignment: Normal. Vertebrae: No acute fracture or focal pathologic process. Disc levels: No spinal canal stenosis. CT LUMBAR SPINE FINDINGS Segmentation: 5 lumbar type vertebrae. Alignment: Normal. Vertebrae: No acute fracture or focal pathologic process. Disc levels: No spinal canal stenosis IMPRESSION: 1. No acute abnormality of the thoracic or lumbar spine. Electronically Signed   By: Ulyses Jarred M.D.   On: 11/14/2022 00:05   CT CHEST ABDOMEN PELVIS W  CONTRAST  Result Date: 11/13/2022 CLINICAL DATA:  Sepsis tachypnea, diminished lung sounds, epigastric abdominal pain and diffuse midline back pain. Productive cough. EXAM: CT CHEST, ABDOMEN, AND PELVIS WITH CONTRAST TECHNIQUE: Multidetector CT imaging of the chest, abdomen and pelvis was performed following the standard protocol during bolus administration of intravenous contrast. RADIATION DOSE REDUCTION: This exam was performed according to the departmental dose-optimization program which includes automated exposure control, adjustment of the mA and/or kV according to patient size and/or use of iterative reconstruction technique. CONTRAST:  14mL OMNIPAQUE IOHEXOL 300 MG/ML  SOLN COMPARISON:  09/01/2022 FINDINGS: CT CHEST FINDINGS Cardiovascular: Heart is normal size. Aorta is normal caliber. Scattered coronary artery and aortic calcifications. Mediastinum/Nodes: No mediastinal, hilar, or axillary adenopathy. Trachea and esophagus are unremarkable. Thyroid unremarkable. Lungs/Pleura: Lungs are clear. No focal airspace opacities or suspicious nodules. No effusions. Musculoskeletal: Chest wall soft tissues are unremarkable. Chronic deformities of the left clavicle and ribs as well as left scapula. Prior plating of the left ribs. Old right rib fractures. Findings are stable since prior study. No acute bony abnormality. CT ABDOMEN PELVIS FINDINGS Hepatobiliary: Geographic low-density posteriorly in the right hepatic lobe, likely geographic fatty infiltration or differential perfusion. Gallbladder unremarkable. Pancreas: No focal abnormality or ductal dilatation. Spleen: No focal abnormality.  Normal size. Adrenals/Urinary Tract: No adrenal abnormality. No focal renal abnormality. No stones or hydronephrosis. Urinary bladder is unremarkable. Stomach/Bowel: Normal appendix. Stomach, large and small bowel grossly unremarkable. Vascular/Lymphatic: Aortic atherosclerosis. No evidence of aneurysm or adenopathy.  Reproductive: No visible focal abnormality. Other: No free fluid or free air. Musculoskeletal: No acute bony abnormality. IMPRESSION: No acute findings in the chest, abdomen or pelvis. Geographic low-density in the posterior right hepatic lobe likely reflects geographic fatty infiltration or differential perfusion. Scattered coronary artery disease, aortic atherosclerosis. Electronically Signed   By: Rolm Baptise M.D.   On: 11/13/2022 23:48   DG Chest Portable 1 View  Result Date: 11/13/2022 CLINICAL DATA:  Productive cough and fever. EXAM: PORTABLE CHEST 1 VIEW COMPARISON:  October 13, 2022 FINDINGS: The heart size and mediastinal contours are within normal limits. There is no evidence of an acute infiltrate, pleural effusion or pneumothorax. Numerous chronic bilateral rib fractures are seen with multiple associated radiopaque fixation plates and screws in place on the left. A chronic deformity is seen involving the left scapula. IMPRESSION: Stable exam without evidence of acute or active cardiopulmonary disease. Electronically Signed   By: Joyce Gross.D.  On: 11/13/2022 20:57     Procedures Procedures   Medications Ordered in ED Medications  lactated ringers bolus 1,000 mL (1,000 mLs Intravenous Bolus 11/13/22 2133)  acetaminophen (TYLENOL) tablet 1,000 mg (1,000 mg Oral Given 11/13/22 2131)  iohexol (OMNIPAQUE) 300 MG/ML solution 100 mL (100 mLs Intravenous Contrast Given 11/13/22 2314)    ED Course/ Medical Decision Making/ A&P                           Medical Decision Making Amount and/or Complexity of Data Reviewed Labs: ordered. Radiology: ordered. ECG/medicine tests: ordered.  Risk OTC drugs. Prescription drug management.   61 year old male presents the emergency room today for evaluation of diffuse bodyaches, fever, and epigastric pain for the past 3 days.  Differential diagnosis includes was limited to viral illness, COVID, flu, pneumonia, sepsis, intra-abdominal  etiology, epidural abscess.  Vital signs show blood pressure 111/64, febrile at 1-2.3 Fahrenheit, mildly tachycardic at 109, with tachypnea satting 100% on room air.  After discussion with my attending, will order labs to start off with.  1 L bolus of LR ordered.  I independent reviewed and interpreted the patient's labs and imaging.  Lipase is normal at 25.  Normal PT/INR.  aPTT slightly elevated at 42.  CBC shows leukocytosis.  Slight anemia with a hemoglobin 11.1 although appears to be around patient's baseline.  CMP shows elevated glucose at 160 although patient known diabetic.  Mild decrease in calcium, total protein, and albumin, otherwise no LFT or electrolyte abnormality.  Lactic acid normal even on repeat.  Respiratory panel negative for COVID and flu.  Cultures are pending.  Urinalysis will need to be collected.  Chest x-ray is unremarkable.  CT imaging shows  No acute findings in the chest, abdomen or pelvis. Geographic low-density in the posterior right hepatic lobe likely reflects geographic fatty infiltration or differential perfusion. Scattered coronary artery disease, aortic atherosclerosis.  Given imaging findings, will start the patient on broad spectrum antibiotics and the rest of the fluids per kilo for sepsis requirements.   On reevaluation, patient's blood pressure significantly decreasing even after fluid bolus.  Patient however is not as tachypneic and appears in no acute distress. Map of 64-65.  Given the patient's borderline MAP score. Admitting team would like him to be more stabilized before admission.   12:04 AM Care of Cletus Gash transferred to Dr. Roxanne Mins at the end of my shift as the patient will require reassessment once labs/imaging have resulted. Patient presentation, ED course, and plan of care discussed with review of all pertinent labs and imaging. Please see his/her note for further details regarding further ED course and disposition. Plan at time watch MAP  score to determine if there is a positive response to fluids versus if he needs pressors to be admitted to the ICU/step down. This may be altered or completely changed at the discretion of the oncoming team pending results of further workup.  Final Clinical Impression(s) / ED Diagnoses Final diagnoses:  Sepsis without acute organ dysfunction, due to unspecified organism [facility])    Rx / DC Orders ED Discharge Orders     None         Sherrell Puller, PA-C 11/14/22 0136    Noemi Chapel, MD 11/16/22 1054
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
## 2022-11-14 NOTE — Progress Notes (Signed)
I was asked to admit patient who was suspected to be septic possibly due to viral source.  Patient is currently unstable and maintaining a MAP less than 65 in the ED.  PA was asked to stabilize patient and reconsult once patient becomes stable, so that patient can then be admitted.

## 2022-11-14 NOTE — ED Provider Notes (Signed)
Care assumed from [facility], [place], patient with concern for sepsis and negative initial evaluation including negative chest x-ray and urinalysis as well as negative CT of chest, abdomen, pelvis.  Initially was borderline hypotensive.  He has received IV fluids and blood pressure is now adequate at 114/70 with mean arterial pressure of 84.  I have discussed case with Dr. Thomes Dinning of [facility], who agrees to admit the patient.   Dione Booze, MD 11/14/22 [phone]

## 2022-11-15 DIAGNOSIS — E1165 Type 2 diabetes mellitus with hyperglycemia: Secondary | ICD-10-CM | POA: Diagnosis not present

## 2022-11-15 DIAGNOSIS — R6510 Systemic inflammatory response syndrome (SIRS) of non-infectious origin without acute organ dysfunction: Secondary | ICD-10-CM | POA: Diagnosis not present

## 2022-11-15 DIAGNOSIS — R509 Fever, unspecified: Secondary | ICD-10-CM | POA: Diagnosis not present

## 2022-11-15 LAB — BASIC METABOLIC PANEL
Anion gap: 5 (ref 5–15)
BUN: 10 mg/dL (ref 6–20)
CO2: 27 mmol/L (ref 22–32)
Calcium: 8.2 mg/dL — ABNORMAL LOW (ref 8.9–10.3)
Chloride: 103 mmol/L (ref 98–111)
Creatinine, Ser: 0.65 mg/dL (ref 0.61–1.24)
GFR, Estimated: >60 mL/min (ref >60)
Glucose, Bld: 149 mg/dL — ABNORMAL HIGH (ref 70–99)
Potassium: 3.8 mmol/L (ref 3.5–5.1)
Sodium: 135 mmol/L (ref 135–145)

## 2022-11-15 LAB — CBC
HCT: 31.9 % — ABNORMAL LOW (ref 39.0–52.0)
Hemoglobin: 10.3 g/dL — ABNORMAL LOW (ref 13.0–17.0)
MCH: 28.5 pg (ref 26.0–34.0)
MCHC: 32.3 g/dL (ref 30.0–36.0)
MCV: 88.4 fL (ref 80.0–100.0)
Platelets: 176 10*3/uL (ref 150–400)
RBC: 3.61 MIL/uL — ABNORMAL LOW (ref 4.22–5.81)
RDW: 13.5 % (ref 11.5–15.5)
WBC: 7.1 10*3/uL (ref 4.0–10.5)
nRBC: 0 % (ref 0.0–0.2)

## 2022-11-15 LAB — GLUCOSE, CAPILLARY
Glucose-Capillary: 95 mg/dL (ref 70–99)
Glucose-Capillary: 189 mg/dL — ABNORMAL HIGH (ref 70–99)
Glucose-Capillary: 100 mg/dL — ABNORMAL HIGH (ref 70–99)
Glucose-Capillary: 145 mg/dL — ABNORMAL HIGH (ref 70–99)

## 2022-11-15 LAB — PROCALCITONIN: Procalcitonin: <0.1 ng/mL

## 2022-11-15 MED ORDER — LACTATED RINGERS IV SOLN: INTRAVENOUS | Status: AC

## 2022-11-15 NOTE — Plan of Care (Signed)
  Problem: Coping: Goal: Ability to adjust to condition or change in health will improve Outcome: Progressing   Problem: Fluid Volume: Goal: Ability to maintain a balanced intake and output will improve Outcome: Progressing   Problem: Health Behavior/Discharge Planning: Goal: Ability to identify and utilize available resources and services will improve Outcome: Progressing Goal: Ability to manage health-related needs will improve Outcome: Progressing   Problem: Metabolic: Goal: Ability to maintain appropriate glucose levels will improve Outcome: Progressing   Problem: Nutritional: Goal: Maintenance of adequate nutrition will improve Outcome: Progressing Goal: Progress toward achieving an optimal weight will improve Outcome: Progressing   Problem: Skin Integrity: Goal: Risk for impaired skin integrity will decrease Outcome: Progressing   Problem: Tissue Perfusion: Goal: Adequacy of tissue perfusion will improve Outcome: Progressing   Problem: Education: Goal: Knowledge of General Education information will improve Description: Including pain rating scale, medication(s)/side effects and non-pharmacologic comfort measures Outcome: Progressing   Problem: Health Behavior/Discharge Planning: Goal: Ability to manage health-related needs will improve Outcome: Progressing   Problem: Clinical Measurements: Goal: Ability to maintain clinical measurements within normal limits will improve Outcome: Progressing Goal: Will remain free from infection Outcome: Progressing Goal: Diagnostic test results will improve Outcome: Progressing Goal: Respiratory complications will improve Outcome: Progressing Goal: Cardiovascular complication will be avoided Outcome: Progressing   Problem: Activity: Goal: Risk for activity intolerance will decrease Outcome: Progressing

## 2022-11-15 NOTE — Progress Notes (Addendum)
PROGRESS NOTE  Eric Vega [name]:[id number] DOB: 1961-05-03 DOA: 11/13/2022 PCP: Eric Vega  Brief History:  61 year old male with a history of diabetes mellitus type 2, hypertension presenting with 3-day history of fevers, chills, rigors, and myalgias.  The patient denied any headache, neck pain, hemoptysis, chest pain, shortness of breath.  He has had some nausea without any emesis or diarrhea.  He complains of some epigastric pain for the same period of time.  He states that he has also had a largely nonproductive cough without hemoptysis.  He denies any alcohol or illicit drug use.  He denies any NSAIDs.  He states that he previously took losartan and metformin, but he has been out of his medications for few weeks.  He states that his appetite has been okay.  He has not had any dysuria, hematuria, hematochezia, melena.  He denies any sick contacts or travels. In the ED, the patient was febrile up to 102.3 F and hypotensive with systolic blood pressure in the 80s.  Oxygen saturation was 99% on room air.  WBC 9.1, hemoglobin 9.1, platelets 176,000.  Lipase 25 Chest x-ray showed old rib fractures.  CT of the chest was negative for any infiltrates or edema but showed chronic left clavicle/rib/scapular deformities.  CT abdomen pelvis showed a geographic low-density in the right hepatic lobe likely geographic fatty infiltration.  Otherwise there was no other acute findings in the CT abdomen.  CT of the thoracic and lumbar spine were negative for any acute findings.  The patient was started on vancomycin, cefepime, metronidazole and IV fluids.   Assessment/Plan: SIRS -Workup in progress -COVID-19 PCR negative -Continue vancomycin and cefepime pending culture data -CT chest and chest x-ray negative for infiltrates -UA negative for significant pyuria -Follow blood and urine cultures -CT abdomen pelvis negative for any acute findings -Lactic acid peaked 1.5 -PCT<0.10 x  2 -continue IVF -plan to d/c abx in next 24 hours if cultures remain neg   Hypertension -Holding losartan   Diabetes mellitus type 2 -06/03/2022 hemoglobin A1c 6.0 -Monitor CBGs -NovoLog sliding scale   Normocytic anemia -Patient previously took iron and B12 -Check B12 -Iron studies         Family Communication:  no  Family at bedside   Consultants:  none   Code Status:  FULL    DVT Prophylaxis:  [place] Lovenox     Procedures: As Listed in Progress Note Above   Antibiotics: Vanc 12/1>> Cefepime 12/1>>         Subjective: Patient denies fevers, chills, headache, chest pain, dyspnea, nausea, vomiting, diarrhea, abdominal pain, dysuria, hematuria, hematochezia, and melena.   Objective: Vitals:   11/14/22 2102 11/15/22 0007 11/15/22 0343 11/15/22 1346  BP: 133/63 (!) 90/52 119/69 115/65  Pulse: 80 (!) 53 66 76  Resp: 20 18 18 14   Temp: (!) 97 F (36.1 C) (!) 97 F (36.1 C) (!) 97.4 F (36.3 C) 97.6 F (36.4 C)  TempSrc:      SpO2: 100% 99% 100% 100%  Weight:      Height:        Intake/Output Summary (Last 24 hours) at 11/15/2022 1447 Last data filed at 11/15/2022 1345 Gross per 24 hour  Intake 1224.32 ml  Output 2425 ml  Net -1200.68 ml   Weight change:  Exam:  General:  Pt is alert, follows commands appropriately, not in acute distress HEENT: No icterus, No thrush, No neck mass, [place]/AT Cardiovascular:  RRR, S1/S2, no rubs, no gallops Respiratory: CTA bilaterally, no wheezing, no crackles, no rhonchi Abdomen: Soft/+BS, non tender, non distended, no guarding Extremities: No edema, No lymphangitis, No petechiae, No rashes, no synovitis   Data Reviewed: I have personally reviewed following labs and imaging studies Basic Metabolic Panel: Recent Labs  Lab 11/13/22 2055 11/14/22 1024 11/15/22 0442  NA 135 138 135  K 3.8 3.7 3.8  CL 102 104 103  CO2 27 29 27   GLUCOSE 160* 104* 149*  BUN 11 9 10   CREATININE 0.83 0.60* 0.65  CALCIUM 8.1* 8.1*  8.2*  MG  --  1.8  --   PHOS  --  3.2  --    Liver Function Tests: Recent Labs  Lab 11/13/22 2055 11/14/22 1024  AST 24 17  ALT 27 22  ALKPHOS 81 69  BILITOT 0.7 0.8  PROT 6.3* 5.9*  ALBUMIN 3.3* 3.0*   Recent Labs  Lab 11/13/22 2055  LIPASE 25   No results for input(s): "AMMONIA" in the last 168 hours. Coagulation Profile: Recent Labs  Lab 11/13/22 2055  INR 1.1   CBC: Recent Labs  Lab 11/13/22 2055 11/14/22 1024 11/15/22 0442  WBC 9.1 7.2 7.1  NEUTROABS 6.0  --   --   HGB 11.1* 10.8* 10.3*  HCT 33.2* 32.8* 31.9*  MCV 89.0 88.6 88.4  PLT 176 165 176   Cardiac Enzymes: No results for input(s): "CKTOTAL", "CKMB", "CKMBINDEX", "TROPONINI" in the last 168 hours. BNP: Invalid input(s): "POCBNP" CBG: Recent Labs  Lab 11/14/22 2103 11/14/22 2121 11/14/22 2155 11/15/22 0817 11/15/22 1216  GLUCAP 40* 54* 127* 95 189*   HbA1C: No results for input(s): "HGBA1C" in the last 72 hours. Urine analysis:    Component Value Date/Time   COLORURINE YELLOW 11/14/2022 0230   APPEARANCEUR CLEAR 11/14/2022 0230   LABSPEC 1.027 11/14/2022 0230   PHURINE 5.0 11/14/2022 0230   GLUCOSEU NEGATIVE 11/14/2022 0230   HGBUR SMALL (A) 11/14/2022 0230   BILIRUBINUR NEGATIVE 11/14/2022 0230   KETONESUR NEGATIVE 11/14/2022 0230   PROTEINUR NEGATIVE 11/14/2022 0230   NITRITE NEGATIVE 11/14/2022 0230   LEUKOCYTESUR SMALL (A) 11/14/2022 0230   Sepsis Labs: @LABRCNTIP (procalcitonin:4,lacticidven:4) ) Recent Results (from the past 240 hour(s))  Blood Culture (routine x 2)     Status: None (Preliminary result)   Collection Time: 11/13/22  8:55 PM   Specimen: BLOOD RIGHT FOREARM  Result Value Ref Range Status   Specimen Description   Final    BLOOD RIGHT FOREARM BOTTLES DRAWN AEROBIC AND ANAEROBIC   Special Requests Blood Culture adequate volume  Final   Culture   Final    NO GROWTH 2 DAYS Performed at [facility], [address], [place], [facility] [address]    Report  Status PENDING  Incomplete  Resp Panel by RT-PCR (Flu A&B, Covid) Anterior Nasal Swab     Status: None   Collection Time: 11/13/22  8:55 PM   Specimen: Anterior Nasal Swab  Result Value Ref Range Status   SARS Coronavirus 2 by RT PCR NEGATIVE NEGATIVE Final    Comment: (NOTE) SARS-CoV-2 target nucleic acids are NOT DETECTED.  The SARS-CoV-2 RNA is generally detectable in upper respiratory specimens during the acute phase of infection. The lowest concentration of SARS-CoV-2 viral copies this assay can detect is 138 copies/mL. A negative result does not preclude SARS-Cov-2 infection and should not be used as the sole basis for treatment or other patient management decisions. A negative result may occur with  improper  specimen collection/handling, submission of specimen other than nasopharyngeal swab, presence of viral mutation(s) within the areas targeted by this assay, and inadequate number of viral copies(<138 copies/mL). A negative result must be combined with clinical observations, patient history, and epidemiological information. The expected result is Negative.  Fact Sheet for Patients:  BloggerCourse.com  Fact Sheet for Healthcare Providers:  SeriousBroker.it  This test is no t yet approved or cleared by the [place] FDA and  has been authorized for detection and/or diagnosis of SARS-CoV-2 by FDA under an Emergency Use Authorization (EUA). This EUA will remain  in effect (meaning this test can be used) for the duration of the COVID-19 declaration under Section 564(b)(1) of the Act, 21 U.S.C.section 360bbb-3(b)(1), unless the authorization is terminated  or revoked sooner.       Influenza A by PCR NEGATIVE NEGATIVE Final   Influenza B by PCR NEGATIVE NEGATIVE Final    Comment: (NOTE) The Xpert Xpress SARS-CoV-2/FLU/RSV plus assay is intended as an aid in the diagnosis of influenza from Nasopharyngeal swab specimens  and should not be used as a sole basis for treatment. Nasal washings and aspirates are unacceptable for Xpert Xpress SARS-CoV-2/FLU/RSV testing.  Fact Sheet for Patients: BloggerCourse.com  Fact Sheet for Healthcare Providers: SeriousBroker.it  This test is not yet approved or cleared by the [place] FDA and has been authorized for detection and/or diagnosis of SARS-CoV-2 by FDA under an Emergency Use Authorization (EUA). This EUA will remain in effect (meaning this test can be used) for the duration of the COVID-19 declaration under Section 564(b)(1) of the Act, 21 U.S.C. section 360bbb-3(b)(1), unless the authorization is terminated or revoked.  Performed at [facility], [address], [place], [place] [address]   Blood Culture (routine x 2)     Status: None (Preliminary result)   Collection Time: 11/13/22  9:41 PM   Specimen: Left Antecubital; Blood  Result Value Ref Range Status   Specimen Description   Final    LEFT ANTECUBITAL BOTTLES DRAWN AEROBIC AND ANAEROBIC   Special Requests Blood Culture adequate volume  Final   Culture   Final    NO GROWTH 2 DAYS Performed at [facility], [address], [place], [place] [address]    Report Status PENDING  Incomplete  Urine Culture     Status: None (Preliminary result)   Collection Time: 11/14/22  2:30 AM   Specimen: In/Out Cath Urine  Result Value Ref Range Status   Specimen Description   Final    IN/OUT CATH URINE Performed at [facility], [address], [place], [place] [address]    Special Requests   Final    NONE Performed at [facility], [address], [place], [place] [address]    Culture   Final    CULTURE REINCUBATED FOR BETTER GROWTH Performed at [facility], [address], [place], [place] [address]    Report Status PENDING  Incomplete     Scheduled Meds:  enoxaparin (LOVENOX) injection  40 mg Subcutaneous Q24H   feeding supplement (GLUCERNA  SHAKE)  237 mL Oral TID BM   ferrous sulfate  325 mg Oral Q breakfast   insulin aspart  0-9 Units Subcutaneous TID WC   Continuous Infusions:  ceFEPime (MAXIPIME) IV 2 g (11/15/22 1341)   vancomycin 1,250 mg (11/14/22 2100)    Procedures/Studies: CT T-SPINE NO CHARGE  Result Date: 11/14/2022 CLINICAL DATA:  Mid and upper back pain EXAM: CT THORACIC AND LUMBAR SPINE WITHOUT CONTRAST TECHNIQUE:  Multidetector CT imaging of the thoracic and lumbar spine was performed without contrast. Multiplanar CT image reconstructions were also generated. RADIATION DOSE REDUCTION: This exam was performed according to the departmental dose-optimization program which includes automated exposure control, adjustment of the mA and/or kV according to patient size and/or use of iterative reconstruction technique. COMPARISON:  None Available. FINDINGS: CT THORACIC SPINE FINDINGS Alignment: Normal. Vertebrae: No acute fracture or focal pathologic process. Disc levels: No spinal canal stenosis. CT LUMBAR SPINE FINDINGS Segmentation: 5 lumbar type vertebrae. Alignment: Normal. Vertebrae: No acute fracture or focal pathologic process. Disc levels: No spinal canal stenosis IMPRESSION: 1. No acute abnormality of the thoracic or lumbar spine. Electronically Signed   By: Deatra Robinson M.D.   On: 11/14/2022 00:05   CT L-SPINE NO CHARGE  Result Date: 11/14/2022 CLINICAL DATA:  Mid and upper back pain EXAM: CT THORACIC AND LUMBAR SPINE WITHOUT CONTRAST TECHNIQUE: Multidetector CT imaging of the thoracic and lumbar spine was performed without contrast. Multiplanar CT image reconstructions were also generated. RADIATION DOSE REDUCTION: This exam was performed according to the departmental dose-optimization program which includes automated exposure control, adjustment of the mA and/or kV according to patient size and/or use of iterative reconstruction technique. COMPARISON:  None Available. FINDINGS: CT THORACIC SPINE FINDINGS Alignment:  Normal. Vertebrae: No acute fracture or focal pathologic process. Disc levels: No spinal canal stenosis. CT LUMBAR SPINE FINDINGS Segmentation: 5 lumbar type vertebrae. Alignment: Normal. Vertebrae: No acute fracture or focal pathologic process. Disc levels: No spinal canal stenosis IMPRESSION: 1. No acute abnormality of the thoracic or lumbar spine. Electronically Signed   By: Deatra Robinson M.D.   On: 11/14/2022 00:05   CT CHEST ABDOMEN PELVIS W CONTRAST  Result Date: 11/13/2022 CLINICAL DATA:  Sepsis tachypnea, diminished lung sounds, epigastric abdominal pain and diffuse midline back pain. Productive cough. EXAM: CT CHEST, ABDOMEN, AND PELVIS WITH CONTRAST TECHNIQUE: Multidetector CT imaging of the chest, abdomen and pelvis was performed following the standard protocol during bolus administration of intravenous contrast. RADIATION DOSE REDUCTION: This exam was performed according to the departmental dose-optimization program which includes automated exposure control, adjustment of the mA and/or kV according to patient size and/or use of iterative reconstruction technique. CONTRAST:  OMNIPAQUE IOHEXOL 300 MG/ML  SOLN COMPARISON:  09/01/2022 FINDINGS: CT CHEST FINDINGS Cardiovascular: Heart is normal size. Aorta is normal caliber. Scattered coronary artery and aortic calcifications. Mediastinum/Nodes: No mediastinal, hilar, or axillary adenopathy. Trachea and esophagus are unremarkable. Thyroid unremarkable. Lungs/Pleura: Lungs are clear. No focal airspace opacities or suspicious nodules. No effusions. Musculoskeletal: Chest wall soft tissues are unremarkable. Chronic deformities of the left clavicle and ribs as well as left scapula. Prior plating of the left ribs. Old right rib fractures. Findings are stable since prior study. No acute bony abnormality. CT ABDOMEN PELVIS FINDINGS Hepatobiliary: Geographic low-density posteriorly in the right hepatic lobe, likely geographic fatty infiltration or  differential perfusion. Gallbladder unremarkable. Pancreas: No focal abnormality or ductal dilatation. Spleen: No focal abnormality.  Normal size. Adrenals/Urinary Tract: No adrenal abnormality. No focal renal abnormality. No stones or hydronephrosis. Urinary bladder is unremarkable. Stomach/Bowel: Normal appendix. Stomach, large and small bowel grossly unremarkable. Vascular/Lymphatic: Aortic atherosclerosis. No evidence of aneurysm or adenopathy. Reproductive: No visible focal abnormality. Other: No free fluid or free air. Musculoskeletal: No acute bony abnormality. IMPRESSION: No acute findings in the chest, abdomen or pelvis. Geographic low-density in the posterior right hepatic lobe likely reflects geographic fatty infiltration or differential perfusion. Scattered coronary artery disease, aortic atherosclerosis.  Electronically Signed   By: Charlett Nose M.D.   On: 11/13/2022 23:48   DG Chest Portable 1 View  Result Date: 11/13/2022 CLINICAL DATA:  Productive cough and fever. EXAM: PORTABLE CHEST 1 VIEW COMPARISON:  October 13, 2022 FINDINGS: The heart size and mediastinal contours are within normal limits. There is no evidence of an acute infiltrate, pleural effusion or pneumothorax. Numerous chronic bilateral rib fractures are seen with multiple associated radiopaque fixation plates and screws in place on the left. A chronic deformity is seen involving the left scapula. IMPRESSION: Stable exam without evidence of acute or active cardiopulmonary disease. Electronically Signed   By: Aram Candela M.D.   On: 11/13/2022 20:57    Catarina Hartshorn, DO  [facility]  If 7PM-7AM, please contact night-coverage [email or web address] Password [email or web address] 11/15/2022, 2:47 PM   LOS: 1 day

## 2022-11-16 DIAGNOSIS — A419 Sepsis, unspecified organism: Secondary | ICD-10-CM | POA: Diagnosis not present

## 2022-11-16 DIAGNOSIS — R509 Fever, unspecified: Secondary | ICD-10-CM | POA: Diagnosis not present

## 2022-11-16 LAB — CBC
HCT: 30.1 % — ABNORMAL LOW (ref 39.0–52.0)
Hemoglobin: 9.9 g/dL — ABNORMAL LOW (ref 13.0–17.0)
MCH: 29.3 pg (ref 26.0–34.0)
MCHC: 32.9 g/dL (ref 30.0–36.0)
MCV: 89.1 fL (ref 80.0–100.0)
Platelets: 178 10*3/uL (ref 150–400)
RBC: 3.38 MIL/uL — ABNORMAL LOW (ref 4.22–5.81)
RDW: 13.5 % (ref 11.5–15.5)
WBC: 5.3 10*3/uL (ref 4.0–10.5)
nRBC: 0 % (ref 0.0–0.2)

## 2022-11-16 LAB — IRON AND TIBC
Iron: 79 ug/dL (ref 45–182)
Saturation Ratios: 38 % (ref 17.9–39.5)
TIBC: 208 ug/dL — ABNORMAL LOW (ref 250–450)
UIBC: 129 ug/dL

## 2022-11-16 LAB — BASIC METABOLIC PANEL
Anion gap: 6 (ref 5–15)
BUN: 12 mg/dL (ref 6–20)
CO2: 28 mmol/L (ref 22–32)
Calcium: 8.2 mg/dL — ABNORMAL LOW (ref 8.9–10.3)
Chloride: 103 mmol/L (ref 98–111)
Creatinine, Ser: 0.74 mg/dL (ref 0.61–1.24)
GFR, Estimated: >60 mL/min (ref >60)
Glucose, Bld: 110 mg/dL — ABNORMAL HIGH (ref 70–99)
Potassium: 4.2 mmol/L (ref 3.5–5.1)
Sodium: 137 mmol/L (ref 135–145)

## 2022-11-16 LAB — MAGNESIUM: Magnesium: 1.9 mg/dL (ref 1.7–2.4)

## 2022-11-16 LAB — RESPIRATORY PANEL BY PCR

## 2022-11-16 LAB — GLUCOSE, CAPILLARY
Glucose-Capillary: 91 mg/dL (ref 70–99)
Glucose-Capillary: 137 mg/dL — ABNORMAL HIGH (ref 70–99)
Glucose-Capillary: 122 mg/dL — ABNORMAL HIGH (ref 70–99)

## 2022-11-16 LAB — VITAMIN B12: Vitamin B-12: 652 pg/mL (ref 180–914)

## 2022-11-16 LAB — FERRITIN: Ferritin: 273 ng/mL (ref 24–336)
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
## 2022-11-16 NOTE — Progress Notes (Signed)
Pt slept through the night, vitals stable. Scheduled antibiotics given as ordered. Pt reported 0/10 pain.

## 2022-11-17 DIAGNOSIS — N390 Urinary tract infection, site not specified: Secondary | ICD-10-CM | POA: Insufficient documentation

## 2022-11-17 DIAGNOSIS — A4181 Sepsis due to Enterococcus: Secondary | ICD-10-CM

## 2022-11-17 DIAGNOSIS — N3001 Acute cystitis with hematuria: Secondary | ICD-10-CM

## 2022-11-17 DIAGNOSIS — R509 Fever, unspecified: Secondary | ICD-10-CM | POA: Diagnosis not present

## 2022-11-17 HISTORY — DX: Sepsis due to Enterococcus: A41.81

## 2022-11-17 HISTORY — DX: Urinary tract infection, site not specified: N39.0

## 2022-11-17 LAB — URINE CULTURE: Culture: 300 — AB

## 2022-11-17 LAB — GLUCOSE, CAPILLARY
Glucose-Capillary: 94 mg/dL (ref 70–99)
Glucose-Capillary: 130 mg/dL — ABNORMAL HIGH (ref 70–99)

## 2022-11-17 MED ORDER — AMOXICILLIN 250 MG PO CAPS: 500.0000 mg | ORAL_CAPSULE | Freq: Three times a day (TID) | ORAL | Status: DC

## 2022-11-17 MED ORDER — AMOXICILLIN 500 MG PO CAPS: 500.0000 mg | ORAL_CAPSULE | Freq: Three times a day (TID) | ORAL | 0 refills | Status: DC

## 2022-11-17 NOTE — Discharge Summary (Addendum)
Physician Discharge Summary   Patient: Eric Vega MRN: [id number] DOB: [birth year]  Admit date:     11/13/2022  Discharge date: 11/17/22  Discharge Physician: Shanon Brow Mia Milan   PCP: Olga Coaster, FNP   Recommendations at discharge:   Please follow up with primary care provider within 1-2 weeks  Please repeat BMP and CBC in one week    Hospital Course: 61 year old male with a history of diabetes mellitus type 2, hypertension presenting with 3-day history of fevers, chills, rigors, and myalgias.  The patient denied any headache, neck pain, hemoptysis, chest pain, shortness of breath.  He has had some nausea without any emesis or diarrhea.  He complains of some epigastric pain for the same period of time.  He states that he has also had a largely nonproductive cough without hemoptysis.  He denies any alcohol or illicit drug use.  He denies any NSAIDs.  He states that he previously took losartan and metformin, but he has been out of his medications for few weeks.  He states that his appetite has been okay.  He has not had any dysuria, hematuria, hematochezia, melena.  He denies any sick contacts or travels. In the ED, the patient was febrile up to 102.3 F and hypotensive with systolic blood pressure in the 80s.  Oxygen saturation was 99% on room air.  WBC 9.1, hemoglobin 9.1, platelets 176,000.  Lipase 25 Chest x-ray showed old rib fractures.  CT of the chest was negative for any infiltrates or edema but showed chronic left clavicle/rib/scapular deformities.  CT abdomen pelvis showed a geographic low-density in the right hepatic lobe likely geographic fatty infiltration.  Otherwise there was no other acute findings in the CT abdomen.  CT of the thoracic and lumbar spine were negative for any acute findings.  The patient was started on vancomycin, cefepime, metronidazole and IV fluids.  Assessment and Plan: Sepsis -due to UTI -COVID-19 PCR negative -Continue vancomycin and cefepime pending culture  data -CT chest and chest x-ray negative for infiltrates -Urine culture prelim = E faecalis -Follow blood and urine cultures -CT abdomen pelvis negative for any acute findings -Lactic acid peaked 1.5 -PCT<0.10 x 2 -continue IVF -sepsis physiology resolved -continue vanc -d/c cefepime -viral resp panel also positive for coronavirus OC43   UTI  -Urine culture prelim = E faecalis -continue empiric vanc pending final culture data -d/c home with 4 more days amoxil   Hypertension -Holding losartan -BP remains well controlled--will not restart   Diabetes mellitus type 2 -06/03/2022 hemoglobin A1c 6.0 -Monitor CBGs -NovoLog sliding scale -restart metformin after d/c   Normocytic anemia -Patient previously took iron and B12 -Check B12--652 -Iron sat 38%, ferritin 73         Consultants: none Procedures performed: none  Disposition: Home Diet recommendation:  Carb modified diet DISCHARGE MEDICATION: Allergies as of 11/17/2022   No Known Allergies      Medication List     STOP taking these medications    doxycycline 100 MG capsule Commonly known as: VIBRAMYCIN   guaiFENesin-dextromethorphan 100-10 MG/5ML syrup Commonly known as: ROBITUSSIN DM   insulin aspart 100 UNIT/ML injection Commonly known as: novoLOG   methocarbamol 500 MG tablet Commonly known as: ROBAXIN   methylPREDNISolone 4 MG Tbpk tablet Commonly known as: MEDROL DOSEPAK   naproxen 500 MG tablet Commonly known as: Naprosyn       TAKE these medications    amoxicillin 500 MG capsule Commonly known as: AMOXIL Take 1 capsule (500 mg total) by  mouth every 8 (eight) hours.   ferrous sulfate 325 (65 FE) MG tablet Take 1 tablet (325 mg total) by mouth daily with breakfast.   metFORMIN 500 MG tablet Commonly known as: GLUCOPHAGE Take 1 tablet (500 mg total) by mouth daily with breakfast.        Discharge Exam: Filed Weights   11/13/22 2023  Weight: 51.3 kg   HEENT:  [place]/AT, No  thrush, no icterus CV:  RRR, no rub, no S3, no S4 Lung:  CTA, no wheeze, no rhonchi Abd:  soft/+BS, NT Ext:  No edema, no lymphangitis, no synovitis, no rash   Condition at discharge: stable  The results of significant diagnostics from this hospitalization (including imaging, microbiology, ancillary and laboratory) are listed below for reference.   Imaging Studies: CT T-SPINE NO CHARGE  Result Date: 11/14/2022 CLINICAL DATA:  Mid and upper back pain EXAM: CT THORACIC AND LUMBAR SPINE WITHOUT CONTRAST TECHNIQUE: Multidetector CT imaging of the thoracic and lumbar spine was performed without contrast. Multiplanar CT image reconstructions were also generated. RADIATION DOSE REDUCTION: This exam was performed according to the departmental dose-optimization program which includes automated exposure control, adjustment of the mA and/or kV according to patient size and/or use of iterative reconstruction technique. COMPARISON:  None Available. FINDINGS: CT THORACIC SPINE FINDINGS Alignment: Normal. Vertebrae: No acute fracture or focal pathologic process. Disc levels: No spinal canal stenosis. CT LUMBAR SPINE FINDINGS Segmentation: 5 lumbar type vertebrae. Alignment: Normal. Vertebrae: No acute fracture or focal pathologic process. Disc levels: No spinal canal stenosis IMPRESSION: 1. No acute abnormality of the thoracic or lumbar spine. Electronically Signed   By: Deatra Robinson M.D.   On: 11/14/2022 00:05   CT L-SPINE NO CHARGE  Result Date: 11/14/2022 CLINICAL DATA:  Mid and upper back pain EXAM: CT THORACIC AND LUMBAR SPINE WITHOUT CONTRAST TECHNIQUE: Multidetector CT imaging of the thoracic and lumbar spine was performed without contrast. Multiplanar CT image reconstructions were also generated. RADIATION DOSE REDUCTION: This exam was performed according to the departmental dose-optimization program which includes automated exposure control, adjustment of the mA and/or kV according to patient size  and/or use of iterative reconstruction technique. COMPARISON:  None Available. FINDINGS: CT THORACIC SPINE FINDINGS Alignment: Normal. Vertebrae: No acute fracture or focal pathologic process. Disc levels: No spinal canal stenosis. CT LUMBAR SPINE FINDINGS Segmentation: 5 lumbar type vertebrae. Alignment: Normal. Vertebrae: No acute fracture or focal pathologic process. Disc levels: No spinal canal stenosis IMPRESSION: 1. No acute abnormality of the thoracic or lumbar spine. Electronically Signed   By: Deatra Robinson M.D.   On: 11/14/2022 00:05   CT CHEST ABDOMEN PELVIS W CONTRAST  Result Date: 11/13/2022 CLINICAL DATA:  Sepsis tachypnea, diminished lung sounds, epigastric abdominal pain and diffuse midline back pain. Productive cough. EXAM: CT CHEST, ABDOMEN, AND PELVIS WITH CONTRAST TECHNIQUE: Multidetector CT imaging of the chest, abdomen and pelvis was performed following the standard protocol during bolus administration of intravenous contrast. RADIATION DOSE REDUCTION: This exam was performed according to the departmental dose-optimization program which includes automated exposure control, adjustment of the mA and/or kV according to patient size and/or use of iterative reconstruction technique. CONTRAST:  OMNIPAQUE IOHEXOL 300 MG/ML  SOLN COMPARISON:  09/01/2022 FINDINGS: CT CHEST FINDINGS Cardiovascular: Heart is normal size. Aorta is normal caliber. Scattered coronary artery and aortic calcifications. Mediastinum/Nodes: No mediastinal, hilar, or axillary adenopathy. Trachea and esophagus are unremarkable. Thyroid unremarkable. Lungs/Pleura: Lungs are clear. No focal airspace opacities or suspicious nodules. No effusions. Musculoskeletal: Chest  wall soft tissues are unremarkable. Chronic deformities of the left clavicle and ribs as well as left scapula. Prior plating of the left ribs. Old right rib fractures. Findings are stable since prior study. No acute bony abnormality. CT ABDOMEN PELVIS FINDINGS  Hepatobiliary: Geographic low-density posteriorly in the right hepatic lobe, likely geographic fatty infiltration or differential perfusion. Gallbladder unremarkable. Pancreas: No focal abnormality or ductal dilatation. Spleen: No focal abnormality.  Normal size. Adrenals/Urinary Tract: No adrenal abnormality. No focal renal abnormality. No stones or hydronephrosis. Urinary bladder is unremarkable. Stomach/Bowel: Normal appendix. Stomach, large and small bowel grossly unremarkable. Vascular/Lymphatic: Aortic atherosclerosis. No evidence of aneurysm or adenopathy. Reproductive: No visible focal abnormality. Other: No free fluid or free air. Musculoskeletal: No acute bony abnormality. IMPRESSION: No acute findings in the chest, abdomen or pelvis. Geographic low-density in the posterior right hepatic lobe likely reflects geographic fatty infiltration or differential perfusion. Scattered coronary artery disease, aortic atherosclerosis. Electronically Signed   By: Rolm Baptise M.D.   On: 11/13/2022 23:48   DG Chest Portable 1 View  Result Date: 11/13/2022 CLINICAL DATA:  Productive cough and fever. EXAM: PORTABLE CHEST 1 VIEW COMPARISON:  October 13, 2022 FINDINGS: The heart size and mediastinal contours are within normal limits. There is no evidence of an acute infiltrate, pleural effusion or pneumothorax. Numerous chronic bilateral rib fractures are seen with multiple associated radiopaque fixation plates and screws in place on the left. A chronic deformity is seen involving the left scapula. IMPRESSION: Stable exam without evidence of acute or active cardiopulmonary disease. Electronically Signed   By: Virgina Norfolk M.D.   On: 11/13/2022 20:57    Microbiology: Results for orders placed or performed during the hospital encounter of 11/13/22  Blood Culture (routine x 2)     Status: None (Preliminary result)   Collection Time: 11/13/22  8:55 PM   Specimen: BLOOD RIGHT FOREARM  Result Value Ref Range Status    Specimen Description   Final    BLOOD RIGHT FOREARM BOTTLES DRAWN AEROBIC AND ANAEROBIC   Special Requests Blood Culture adequate volume  Final   Culture   Final    NO GROWTH 4 DAYS Performed at [facility], [address], [place], [place] [address]    Report Status PENDING  Incomplete  Resp Panel by RT-PCR (Flu A&B, Covid) Anterior Nasal Swab     Status: None   Collection Time: 11/13/22  8:55 PM   Specimen: Anterior Nasal Swab  Result Value Ref Range Status   SARS Coronavirus 2 by RT PCR NEGATIVE NEGATIVE Final    Comment: (NOTE) SARS-CoV-2 target nucleic acids are NOT DETECTED.  The SARS-CoV-2 RNA is generally detectable in upper respiratory specimens during the acute phase of infection. The lowest concentration of SARS-CoV-2 viral copies this assay can detect is 138 copies/mL. A negative result does not preclude SARS-Cov-2 infection and should not be used as the sole basis for treatment or other patient management decisions. A negative result may occur with  improper specimen collection/handling, submission of specimen other than nasopharyngeal swab, presence of viral mutation(s) within the areas targeted by this assay, and inadequate number of viral copies(<138 copies/mL). A negative result must be combined with clinical observations, patient history, and epidemiological information. The expected result is Negative.  Fact Sheet for Patients:  EntrepreneurPulse.com.au  Fact Sheet for Healthcare Providers:  IncredibleEmployment.be  This test is no t yet approved or cleared by the [place] FDA and  has been authorized for detection and/or diagnosis of SARS-CoV-2  by FDA under an Emergency Use Authorization (EUA). This EUA will remain  in effect (meaning this test can be used) for the duration of the COVID-19 declaration under Section 564(b)(1) of the Act, 21 U.S.C.section 360bbb-3(b)(1), unless the authorization is terminated  or  revoked sooner.       Influenza A by PCR NEGATIVE NEGATIVE Final   Influenza B by PCR NEGATIVE NEGATIVE Final    Comment: (NOTE) The Xpert Xpress SARS-CoV-2/FLU/RSV plus assay is intended as an aid in the diagnosis of influenza from Nasopharyngeal swab specimens and should not be used as a sole basis for treatment. Nasal washings and aspirates are unacceptable for Xpert Xpress SARS-CoV-2/FLU/RSV testing.  Fact Sheet for Patients: EntrepreneurPulse.com.au  Fact Sheet for Healthcare Providers: IncredibleEmployment.be  This test is not yet approved or cleared by the [place] FDA and has been authorized for detection and/or diagnosis of SARS-CoV-2 by FDA under an Emergency Use Authorization (EUA). This EUA will remain in effect (meaning this test can be used) for the duration of the COVID-19 declaration under Section 564(b)(1) of the Act, 21 U.S.C. section 360bbb-3(b)(1), unless the authorization is terminated or revoked.  Performed at [facility], [address] [place], [place] [address]   Respiratory (~20 pathogens) panel by PCR     Status: Abnormal   Collection Time: 11/13/22  8:55 PM   Specimen: Nasopharyngeal Swab; Respiratory  Result Value Ref Range Status   Adenovirus NOT DETECTED NOT DETECTED Final   Coronavirus 229E NOT DETECTED NOT DETECTED Final    Comment: (NOTE) The Coronavirus on the Respiratory Panel, DOES NOT test for the novel  Coronavirus (2019 nCoV)    Coronavirus HKU1 NOT DETECTED NOT DETECTED Final   Coronavirus NL63 NOT DETECTED NOT DETECTED Final   Coronavirus OC43 DETECTED (A) NOT DETECTED Final   Metapneumovirus NOT DETECTED NOT DETECTED Final   Rhinovirus / Enterovirus NOT DETECTED NOT DETECTED Final   Influenza A NOT DETECTED NOT DETECTED Final   Influenza B NOT DETECTED NOT DETECTED Final   Parainfluenza Virus 1 NOT DETECTED NOT DETECTED Final   Parainfluenza Virus 2 NOT DETECTED NOT DETECTED Final    Parainfluenza Virus 3 NOT DETECTED NOT DETECTED Final   Parainfluenza Virus 4 NOT DETECTED NOT DETECTED Final   Respiratory Syncytial Virus NOT DETECTED NOT DETECTED Final   Bordetella pertussis NOT DETECTED NOT DETECTED Final   Bordetella Parapertussis NOT DETECTED NOT DETECTED Final   Chlamydophila pneumoniae NOT DETECTED NOT DETECTED Final   Mycoplasma pneumoniae NOT DETECTED NOT DETECTED Final    Comment: Performed at [facility], [place]. [address] [place], [place] [address]  Blood Culture (routine x 2)     Status: None (Preliminary result)   Collection Time: 11/13/22  9:41 PM   Specimen: Left Antecubital; Blood  Result Value Ref Range Status   Specimen Description   Final    LEFT ANTECUBITAL BOTTLES DRAWN AEROBIC AND ANAEROBIC   Special Requests Blood Culture adequate volume  Final   Culture   Final    NO GROWTH 4 DAYS Performed at [facility], [address] [place], [place] [address]    Report Status PENDING  Incomplete  Urine Culture     Status: Abnormal   Collection Time: 11/14/22  2:30 AM   Specimen: In/Out Cath Urine  Result Value Ref Range Status   Specimen Description   Final    IN/OUT CATH URINE Performed at [facility], [address], [place], [place] [address]    Special Requests  Final    NONE Performed at [facility], [address] [place], [place] [address]    Culture 300 COLONIES/mL ENTEROCOCCUS FAECALIS (A)  Final   Report Status 11/17/2022 FINAL  Final   Organism ID, Bacteria ENTEROCOCCUS FAECALIS (A)  Final      Susceptibility   Enterococcus faecalis - MIC*    AMPICILLIN <=2 SENSITIVE Sensitive     NITROFURANTOIN 32 SENSITIVE Sensitive     VANCOMYCIN 1 SENSITIVE Sensitive     * 300 COLONIES/mL ENTEROCOCCUS FAECALIS    Labs: CBC: Recent Labs  Lab 11/13/22 2055 11/14/22 1024 11/15/22 0442 11/16/22 0511  WBC 9.1 7.2 7.1 5.3  NEUTROABS 6.0  --   --   --   HGB 11.1* 10.8* 10.3* 9.9*  HCT 33.2* 32.8* 31.9* 30.1*  MCV 89.0 88.6 88.4  89.1  PLT 176 165 176 0000000   Basic Metabolic Panel: Recent Labs  Lab 11/13/22 2055 11/14/22 1024 11/15/22 0442 11/16/22 0511  NA 135 138 135 137  K 3.8 3.7 3.8 4.2  CL 102 104 103 103  CO2 27 29 27 28   GLUCOSE 160* 104* 149* 110*  BUN 11 9 10 12   CREATININE 0.83 0.60* 0.65 0.74  CALCIUM 8.1* 8.1* 8.2* 8.2*  MG  --  1.8  --  1.9  PHOS  --  3.2  --   --    Liver Function Tests: Recent Labs  Lab 11/13/22 2055 11/14/22 1024  AST 24 17  ALT 27 22  ALKPHOS 81 69  BILITOT 0.7 0.8  PROT 6.3* 5.9*  ALBUMIN 3.3* 3.0*   CBG: Recent Labs  Lab 11/16/22 0735 11/16/22 1124 11/16/22 2043 11/17/22 0728 11/17/22 1130  GLUCAP 91 137* 122* 94 130*    Discharge time spent: greater than 30 minutes.  Signed: Orson Eva, MD [facility] 11/17/2022

## 2022-11-17 NOTE — Progress Notes (Signed)
Patient discharged home today, transported home by family. Discharge summary went over with patient via tele interpreter, patient verbalized understanding. Belongings sent with patient.

## 2022-11-17 NOTE — Progress Notes (Signed)
PROGRESS NOTE  Lindberg Zenon DUK:[id number] DOB: [birth year] DOA: 11/13/2022 PCP: Shelby Dubin, FNP  Brief History:  61 year old male with a history of diabetes mellitus type 2, hypertension presenting with 3-day history of fevers, chills, rigors, and myalgias.  The patient denied any headache, neck pain, hemoptysis, chest pain, shortness of breath.  He has had some nausea without any emesis or diarrhea.  He complains of some epigastric pain for the same period of time.  He states that he has also had a largely nonproductive cough without hemoptysis.  He denies any alcohol or illicit drug use.  He denies any NSAIDs.  He states that he previously took losartan and metformin, but he has been out of his medications for few weeks.  He states that his appetite has been okay.  He has not had any dysuria, hematuria, hematochezia, melena.  He denies any sick contacts or travels. In the ED, the patient was febrile up to 102.3 F and hypotensive with systolic blood pressure in the 80s.  Oxygen saturation was 99% on room air.  WBC 9.1, hemoglobin 9.1, platelets 176,000.  Lipase 25 Chest x-ray showed old rib fractures.  CT of the chest was negative for any infiltrates or edema but showed chronic left clavicle/rib/scapular deformities.  CT abdomen pelvis showed a geographic low-density in the right hepatic lobe likely geographic fatty infiltration.  Otherwise there was no other acute findings in the CT abdomen.  CT of the thoracic and lumbar spine were negative for any acute findings.  The patient was started on vancomycin, cefepime, metronidazole and IV fluids.   Assessment/Plan: Sepsis -due to UTI -COVID-19 PCR negative -Continue vancomycin and cefepime pending culture data -CT chest and chest x-ray negative for infiltrates -Urine culture prelim = E faecalis -Follow blood and urine cultures -CT abdomen pelvis negative for any acute findings -Lactic acid peaked 1.5 -PCT<0.10 x 2 -continue  IVF -sepsis physiology resolved -continue vanc -d/c cefepime  UTI  -Urine culture prelim = E faecalis -continue empiric vanc pending final culture data  Hypertension -Holding losartan -BP remains well controlled   Diabetes mellitus type 2 -06/03/2022 hemoglobin A1c 6.0 -Monitor CBGs -NovoLog sliding scale   Normocytic anemia -Patient previously took iron and B12 -Check B12--652 -Iron sat 38%, ferritin 73         Family Communication:  no  Family at bedside   Consultants:  none   Code Status:  FULL    DVT Prophylaxis:  [place] Lovenox     Procedures: As Listed in Progress Note Above   Antibiotics: Vanc 12/1>> Cefepime 12/1>>12/4       Subjective: Patient denies fevers, chills, headache, chest pain, dyspnea, nausea, vomiting, diarrhea, abdominal pain, dysuria, hematuria, hematochezia, and melena.   Objective: Vitals:   11/16/22 1408 11/16/22 2041 11/17/22 0615 11/17/22 0827  BP: (!) 141/76 (!) 106/59 (!) 96/58 (!) 151/74  Pulse: 65 60 (!) 54   Resp: 15 14 20    Temp: 97.6 F (36.4 C) 97.6 F (36.4 C) (!) 97.5 F (36.4 C)   TempSrc:      SpO2: 100% 99% 100%   Weight:      Height:        Intake/Output Summary (Last 24 hours) at 11/17/2022 1240 Last data filed at 11/17/2022 0943 Gross per 24 hour  Intake 820 ml  Output 2250 ml  Net -1430 ml   Weight change:  Exam:  General:  Pt is alert, follows commands appropriately,  not in acute distress HEENT: No icterus, No thrush, No neck mass, [place]/AT Cardiovascular: RRR, S1/S2, no rubs, no gallops Respiratory: CTA bilaterally, no wheezing, no crackles, no rhonchi Abdomen: Soft/+BS, non tender, non distended, no guarding Extremities: No edema, No lymphangitis, No petechiae, No rashes, no synovitis   Data Reviewed: I have personally reviewed following labs and imaging studies Basic Metabolic Panel: Recent Labs  Lab 11/13/22 2055 11/14/22 1024 11/15/22 0442 11/16/22 0511  NA 135 138 135 137  K 3.8  3.7 3.8 4.2  CL 102 104 103 103  CO2 27 29 27 28   GLUCOSE 160* 104* 149* 110*  BUN 11 9 10 12   CREATININE 0.83 0.60* 0.65 0.74  CALCIUM 8.1* 8.1* 8.2* 8.2*  MG  --  1.8  --  1.9  PHOS  --  3.2  --   --    Liver Function Tests: Recent Labs  Lab 11/13/22 2055 11/14/22 1024  AST 24 17  ALT 27 22  ALKPHOS 81 69  BILITOT 0.7 0.8  PROT 6.3* 5.9*  ALBUMIN 3.3* 3.0*   Recent Labs  Lab 11/13/22 2055  LIPASE 25   No results for input(s): "AMMONIA" in the last 168 hours. Coagulation Profile: Recent Labs  Lab 11/13/22 2055  INR 1.1   CBC: Recent Labs  Lab 11/13/22 2055 11/14/22 1024 11/15/22 0442 11/16/22 0511  WBC 9.1 7.2 7.1 5.3  NEUTROABS 6.0  --   --   --   HGB 11.1* 10.8* 10.3* 9.9*  HCT 33.2* 32.8* 31.9* 30.1*  MCV 89.0 88.6 88.4 89.1  PLT 176 165 176 178   Cardiac Enzymes: No results for input(s): "CKTOTAL", "CKMB", "CKMBINDEX", "TROPONINI" in the last 168 hours. BNP: Invalid input(s): "POCBNP" CBG: Recent Labs  Lab 11/16/22 0735 11/16/22 1124 11/16/22 2043 11/17/22 0728 11/17/22 1130  GLUCAP 91 137* 122* 94 130*   HbA1C: No results for input(s): "HGBA1C" in the last 72 hours. Urine analysis:    Component Value Date/Time   COLORURINE YELLOW 11/14/2022 0230   APPEARANCEUR CLEAR 11/14/2022 0230   LABSPEC 1.027 11/14/2022 0230   PHURINE 5.0 11/14/2022 0230   GLUCOSEU NEGATIVE 11/14/2022 0230   HGBUR SMALL (A) 11/14/2022 0230   BILIRUBINUR NEGATIVE 11/14/2022 0230   KETONESUR NEGATIVE 11/14/2022 0230   PROTEINUR NEGATIVE 11/14/2022 0230   NITRITE NEGATIVE 11/14/2022 0230   LEUKOCYTESUR SMALL (A) 11/14/2022 0230   Sepsis Labs: @LABRCNTIP (procalcitonin:4,lacticidven:4) ) Recent Results (from the past 240 hour(s))  Blood Culture (routine x 2)     Status: None (Preliminary result)   Collection Time: 11/13/22  8:55 PM   Specimen: BLOOD RIGHT FOREARM  Result Value Ref Range Status   Specimen Description   Final    BLOOD RIGHT FOREARM BOTTLES  DRAWN AEROBIC AND ANAEROBIC   Special Requests Blood Culture adequate volume  Final   Culture   Final    NO GROWTH 4 DAYS Performed at [facility], [address] [place], [facility] [address]    Report Status PENDING  Incomplete  Resp Panel by RT-PCR (Flu A&B, Covid) Anterior Nasal Swab     Status: None   Collection Time: 11/13/22  8:55 PM   Specimen: Anterior Nasal Swab  Result Value Ref Range Status   SARS Coronavirus 2 by RT PCR NEGATIVE NEGATIVE Final    Comment: (NOTE) SARS-CoV-2 target nucleic acids are NOT DETECTED.  The SARS-CoV-2 RNA is generally detectable in upper respiratory specimens during the acute phase of infection. The lowest concentration of SARS-CoV-2 viral copies this  assay can detect is 138 copies/mL. A negative result does not preclude SARS-Cov-2 infection and should not be used as the sole basis for treatment or other patient management decisions. A negative result may occur with  improper specimen collection/handling, submission of specimen other than nasopharyngeal swab, presence of viral mutation(s) within the areas targeted by this assay, and inadequate number of viral copies(<138 copies/mL). A negative result must be combined with clinical observations, patient history, and epidemiological information. The expected result is Negative.  Fact Sheet for Patients:  BloggerCourse.com  Fact Sheet for Healthcare Providers:  SeriousBroker.it  This test is no t yet approved or cleared by the [place] FDA and  has been authorized for detection and/or diagnosis of SARS-CoV-2 by FDA under an Emergency Use Authorization (EUA). This EUA will remain  in effect (meaning this test can be used) for the duration of the COVID-19 declaration under Section 564(b)(1) of the Act, 21 U.S.C.section 360bbb-3(b)(1), unless the authorization is terminated  or revoked sooner.       Influenza A by PCR NEGATIVE NEGATIVE  Final   Influenza B by PCR NEGATIVE NEGATIVE Final    Comment: (NOTE) The Xpert Xpress SARS-CoV-2/FLU/RSV plus assay is intended as an aid in the diagnosis of influenza from Nasopharyngeal swab specimens and should not be used as a sole basis for treatment. Nasal washings and aspirates are unacceptable for Xpert Xpress SARS-CoV-2/FLU/RSV testing.  Fact Sheet for Patients: BloggerCourse.com  Fact Sheet for Healthcare Providers: SeriousBroker.it  This test is not yet approved or cleared by the [place] FDA and has been authorized for detection and/or diagnosis of SARS-CoV-2 by FDA under an Emergency Use Authorization (EUA). This EUA will remain in effect (meaning this test can be used) for the duration of the COVID-19 declaration under Section 564(b)(1) of the Act, 21 U.S.C. section 360bbb-3(b)(1), unless the authorization is terminated or revoked.  Performed at [facility], [address], [place], [place] [address]   Respiratory (~20 pathogens) panel by PCR     Status: Abnormal   Collection Time: 11/13/22  8:55 PM   Specimen: Nasopharyngeal Swab; Respiratory  Result Value Ref Range Status   Adenovirus NOT DETECTED NOT DETECTED Final   Coronavirus 229E NOT DETECTED NOT DETECTED Final    Comment: (NOTE) The Coronavirus on the Respiratory Panel, DOES NOT test for the novel  Coronavirus (2019 nCoV)    Coronavirus HKU1 NOT DETECTED NOT DETECTED Final   Coronavirus NL63 NOT DETECTED NOT DETECTED Final   Coronavirus OC43 DETECTED (A) NOT DETECTED Final   Metapneumovirus NOT DETECTED NOT DETECTED Final   Rhinovirus / Enterovirus NOT DETECTED NOT DETECTED Final   Influenza A NOT DETECTED NOT DETECTED Final   Influenza B NOT DETECTED NOT DETECTED Final   Parainfluenza Virus 1 NOT DETECTED NOT DETECTED Final   Parainfluenza Virus 2 NOT DETECTED NOT DETECTED Final   Parainfluenza Virus 3 NOT DETECTED NOT DETECTED Final    Parainfluenza Virus 4 NOT DETECTED NOT DETECTED Final   Respiratory Syncytial Virus NOT DETECTED NOT DETECTED Final   Bordetella pertussis NOT DETECTED NOT DETECTED Final   Bordetella Parapertussis NOT DETECTED NOT DETECTED Final   Chlamydophila pneumoniae NOT DETECTED NOT DETECTED Final   Mycoplasma pneumoniae NOT DETECTED NOT DETECTED Final    Comment: Performed at [facility], [address] [place], [place] [address]  Blood Culture (routine x 2)     Status: None (Preliminary result)   Collection Time: 11/13/22  9:41 PM   Specimen: Left  Antecubital; Blood  Result Value Ref Range Status   Specimen Description   Final    LEFT ANTECUBITAL BOTTLES DRAWN AEROBIC AND ANAEROBIC   Special Requests Blood Culture adequate volume  Final   Culture   Final    NO GROWTH 4 DAYS Performed at [facility], [address] [place], [place] [address]    Report Status PENDING  Incomplete  Urine Culture     Status: Abnormal   Collection Time: 11/14/22  2:30 AM   Specimen: In/Out Cath Urine  Result Value Ref Range Status   Specimen Description   Final    IN/OUT CATH URINE Performed at [facility], [address] [place], [place] [address]    Special Requests   Final    NONE Performed at [facility], [address] [place], [place] [address]    Culture 300 COLONIES/mL ENTEROCOCCUS FAECALIS (A)  Final   Report Status 11/17/2022 FINAL  Final   Organism ID, Bacteria ENTEROCOCCUS FAECALIS (A)  Final      Susceptibility   Enterococcus faecalis - MIC*    AMPICILLIN <=2 SENSITIVE Sensitive     NITROFURANTOIN 32 SENSITIVE Sensitive     VANCOMYCIN 1 SENSITIVE Sensitive     * 300 COLONIES/mL ENTEROCOCCUS FAECALIS     Scheduled Meds:  amoxicillin  500 mg Oral Q8H   enoxaparin (LOVENOX) injection  40 mg Subcutaneous Q24H   feeding supplement (GLUCERNA SHAKE)  237 mL Oral TID BM   ferrous sulfate  325 mg Oral Q breakfast   insulin aspart  0-9 Units Subcutaneous TID WC   Continuous  Infusions:  Procedures/Studies: CT T-SPINE NO CHARGE  Result Date: 11/14/2022 CLINICAL DATA:  Mid and upper back pain EXAM: CT THORACIC AND LUMBAR SPINE WITHOUT CONTRAST TECHNIQUE: Multidetector CT imaging of the thoracic and lumbar spine was performed without contrast. Multiplanar CT image reconstructions were also generated. RADIATION DOSE REDUCTION: This exam was performed according to the departmental dose-optimization program which includes automated exposure control, adjustment of the mA and/or kV according to patient size and/or use of iterative reconstruction technique. COMPARISON:  None Available. FINDINGS: CT THORACIC SPINE FINDINGS Alignment: Normal. Vertebrae: No acute fracture or focal pathologic process. Disc levels: No spinal canal stenosis. CT LUMBAR SPINE FINDINGS Segmentation: 5 lumbar type vertebrae. Alignment: Normal. Vertebrae: No acute fracture or focal pathologic process. Disc levels: No spinal canal stenosis IMPRESSION: 1. No acute abnormality of the thoracic or lumbar spine. Electronically Signed   By: Deatra RobinsonKevin  Herman M.D.   On: 11/14/2022 00:05   CT L-SPINE NO CHARGE  Result Date: 11/14/2022 CLINICAL DATA:  Mid and upper back pain EXAM: CT THORACIC AND LUMBAR SPINE WITHOUT CONTRAST TECHNIQUE: Multidetector CT imaging of the thoracic and lumbar spine was performed without contrast. Multiplanar CT image reconstructions were also generated. RADIATION DOSE REDUCTION: This exam was performed according to the departmental dose-optimization program which includes automated exposure control, adjustment of the mA and/or kV according to patient size and/or use of iterative reconstruction technique. COMPARISON:  None Available. FINDINGS: CT THORACIC SPINE FINDINGS Alignment: Normal. Vertebrae: No acute fracture or focal pathologic process. Disc levels: No spinal canal stenosis. CT LUMBAR SPINE FINDINGS Segmentation: 5 lumbar type vertebrae. Alignment: Normal. Vertebrae: No acute fracture or  focal pathologic process. Disc levels: No spinal canal stenosis IMPRESSION: 1. No acute abnormality of the thoracic or lumbar spine. Electronically Signed   By: Deatra RobinsonKevin  Herman M.D.   On: 11/14/2022 00:05   CT CHEST ABDOMEN PELVIS W CONTRAST  Result Date:  11/13/2022 CLINICAL DATA:  Sepsis tachypnea, diminished lung sounds, epigastric abdominal pain and diffuse midline back pain. Productive cough. EXAM: CT CHEST, ABDOMEN, AND PELVIS WITH CONTRAST TECHNIQUE: Multidetector CT imaging of the chest, abdomen and pelvis was performed following the standard protocol during bolus administration of intravenous contrast. RADIATION DOSE REDUCTION: This exam was performed according to the departmental dose-optimization program which includes automated exposure control, adjustment of the mA and/or kV according to patient size and/or use of iterative reconstruction technique. CONTRAST:  OMNIPAQUE IOHEXOL 300 MG/ML  SOLN COMPARISON:  09/01/2022 FINDINGS: CT CHEST FINDINGS Cardiovascular: Heart is normal size. Aorta is normal caliber. Scattered coronary artery and aortic calcifications. Mediastinum/Nodes: No mediastinal, hilar, or axillary adenopathy. Trachea and esophagus are unremarkable. Thyroid unremarkable. Lungs/Pleura: Lungs are clear. No focal airspace opacities or suspicious nodules. No effusions. Musculoskeletal: Chest wall soft tissues are unremarkable. Chronic deformities of the left clavicle and ribs as well as left scapula. Prior plating of the left ribs. Old right rib fractures. Findings are stable since prior study. No acute bony abnormality. CT ABDOMEN PELVIS FINDINGS Hepatobiliary: Geographic low-density posteriorly in the right hepatic lobe, likely geographic fatty infiltration or differential perfusion. Gallbladder unremarkable. Pancreas: No focal abnormality or ductal dilatation. Spleen: No focal abnormality.  Normal size. Adrenals/Urinary Tract: No adrenal abnormality. No focal renal abnormality. No  stones or hydronephrosis. Urinary bladder is unremarkable. Stomach/Bowel: Normal appendix. Stomach, large and small bowel grossly unremarkable. Vascular/Lymphatic: Aortic atherosclerosis. No evidence of aneurysm or adenopathy. Reproductive: No visible focal abnormality. Other: No free fluid or free air. Musculoskeletal: No acute bony abnormality. IMPRESSION: No acute findings in the chest, abdomen or pelvis. Geographic low-density in the posterior right hepatic lobe likely reflects geographic fatty infiltration or differential perfusion. Scattered coronary artery disease, aortic atherosclerosis. Electronically Signed   By: Charlett Nose M.D.   On: 11/13/2022 23:48   DG Chest Portable 1 View  Result Date: 11/13/2022 CLINICAL DATA:  Productive cough and fever. EXAM: PORTABLE CHEST 1 VIEW COMPARISON:  October 13, 2022 FINDINGS: The heart size and mediastinal contours are within normal limits. There is no evidence of an acute infiltrate, pleural effusion or pneumothorax. Numerous chronic bilateral rib fractures are seen with multiple associated radiopaque fixation plates and screws in place on the left. A chronic deformity is seen involving the left scapula. IMPRESSION: Stable exam without evidence of acute or active cardiopulmonary disease. Electronically Signed   By: Aram Candela M.D.   On: 11/13/2022 20:57    Catarina Hartshorn, DO  [facility]  If 7PM-7AM, please contact night-coverage [email or web address] Password [email or web address] 11/17/2022, 12:40 PM   LOS: 3 days

## 2022-11-17 NOTE — Progress Notes (Signed)
Vitals stable, 0/10 pain reported. Pt slept through the night and got IV antibiotics as ordered.

## 2022-11-18 ENCOUNTER — Encounter (HOSPITAL_COMMUNITY): Payer: Self-pay

## 2022-11-18 ENCOUNTER — Emergency Department (HOSPITAL_COMMUNITY)
Admission: EM | Admit: 2022-11-18 | Discharge: 2022-11-18 | Disposition: A | Discharge status: Home / Self Care | Payer: Medicaid Other | Attending: Emergency Medicine | Admitting: Emergency Medicine

## 2022-11-18 ENCOUNTER — Other Ambulatory Visit: Payer: Self-pay

## 2022-11-18 DIAGNOSIS — R7401 Elevation of levels of liver transaminase levels: Secondary | ICD-10-CM | POA: Diagnosis not present

## 2022-11-18 DIAGNOSIS — R112 Nausea with vomiting, unspecified: Secondary | ICD-10-CM | POA: Insufficient documentation

## 2022-11-18 DIAGNOSIS — R1013 Epigastric pain: Secondary | ICD-10-CM | POA: Diagnosis not present

## 2022-11-18 DIAGNOSIS — E1165 Type 2 diabetes mellitus with hyperglycemia: Secondary | ICD-10-CM | POA: Diagnosis not present

## 2022-11-18 DIAGNOSIS — R1012 Left upper quadrant pain: Secondary | ICD-10-CM | POA: Insufficient documentation

## 2022-11-18 DIAGNOSIS — Z7984 Long term (current) use of oral hypoglycemic drugs: Secondary | ICD-10-CM | POA: Diagnosis not present

## 2022-11-18 LAB — CBC WITH DIFFERENTIAL/PLATELET
Abs Immature Granulocytes: 0.02 10*3/uL (ref 0.00–0.07)
Basophils Absolute: 0 10*3/uL (ref 0.0–0.1)
Basophils Relative: 0 %
Eosinophils Absolute: 0.3 10*3/uL (ref 0.0–0.5)
Eosinophils Relative: 4 %
HCT: 32.8 % — ABNORMAL LOW (ref 39.0–52.0)
Hemoglobin: 10.8 g/dL — ABNORMAL LOW (ref 13.0–17.0)
Immature Granulocytes: 0 %
Lymphocytes Relative: 27 %
Lymphs Abs: 2.2 10*3/uL (ref 0.7–4.0)
MCH: 28.6 pg (ref 26.0–34.0)
MCHC: 32.9 g/dL (ref 30.0–36.0)
MCV: 87 fL (ref 80.0–100.0)
Monocytes Absolute: 0.4 10*3/uL (ref 0.1–1.0)
Monocytes Relative: 5 %
Neutro Abs: 5.2 10*3/uL (ref 1.7–7.7)
Neutrophils Relative %: 64 %
Platelets: 270 10*3/uL (ref 150–400)
RBC: 3.77 MIL/uL — ABNORMAL LOW (ref 4.22–5.81)
RDW: 13.2 % (ref 11.5–15.5)
WBC: 8.1 10*3/uL (ref 4.0–10.5)
nRBC: 0 % (ref 0.0–0.2)

## 2022-11-18 LAB — COMPREHENSIVE METABOLIC PANEL
ALT: 77 U/L — ABNORMAL HIGH (ref 0–44)
AST: 101 U/L — ABNORMAL HIGH (ref 15–41)
Albumin: 3.7 g/dL (ref 3.5–5.0)
Alkaline Phosphatase: 87 U/L (ref 38–126)
Anion gap: 9 (ref 5–15)
BUN: 18 mg/dL (ref 6–20)
CO2: 28 mmol/L (ref 22–32)
Calcium: 8.2 mg/dL — ABNORMAL LOW (ref 8.9–10.3)
Chloride: 94 mmol/L — ABNORMAL LOW (ref 98–111)
Creatinine, Ser: 0.79 mg/dL (ref 0.61–1.24)
GFR, Estimated: >60 mL/min (ref >60)
Glucose, Bld: 111 mg/dL — ABNORMAL HIGH (ref 70–99)
Potassium: 4.1 mmol/L (ref 3.5–5.1)
Sodium: 131 mmol/L — ABNORMAL LOW (ref 135–145)
Total Bilirubin: 0.4 mg/dL (ref 0.3–1.2)
Total Protein: 6.9 g/dL (ref 6.5–8.1)

## 2022-11-18 LAB — CULTURE, BLOOD (ROUTINE X 2)
Culture: NO GROWTH
Culture: NO GROWTH
Special Requests: ADEQUATE
Special Requests: ADEQUATE

## 2022-11-18 LAB — CBG MONITORING, ED: Glucose-Capillary: 131 mg/dL — ABNORMAL HIGH (ref 70–99)

## 2022-11-18 LAB — LIPASE, BLOOD: Lipase: 27 U/L (ref 11–51)

## 2022-11-18 MED ORDER — ONDANSETRON HCL 4 MG/2ML IJ SOLN
4.0000 mg | Freq: Once | INTRAMUSCULAR | Status: AC
  Administered 2022-11-18: 4 mg via INTRAVENOUS
  Filled 2022-11-18: qty 2

## 2022-11-18 MED ORDER — SODIUM CHLORIDE 0.9 % IV BOLUS
1000.0000 mL | Freq: Once | INTRAVENOUS | Status: AC
  Administered 2022-11-18: 1000 mL via INTRAVENOUS

## 2022-11-18 MED ORDER — ONDANSETRON 8 MG PO TBDP: 8.0000 mg | ORAL_TABLET | Freq: Three times a day (TID) | ORAL | 0 refills | Status: AC | PRN

## 2022-11-18 MED ORDER — FAMOTIDINE 20 MG PO TABS
20.0000 mg | ORAL_TABLET | Freq: Once | ORAL | Status: AC
  Administered 2022-11-18: 20 mg via ORAL
  Filled 2022-11-18: qty 1

## 2022-11-18 MED ORDER — ALUM & MAG HYDROXIDE-SIMETH 200-200-20 MG/5ML PO SUSP
30.0000 mL | Freq: Once | ORAL | Status: AC
  Administered 2022-11-18: 30 mL via ORAL
  Filled 2022-11-18: qty 30

## 2022-11-18 NOTE — ED Triage Notes (Signed)
Pt presents to ED with complaints of vomiting x 6 hours, pt also c/o abdominal pain.

## 2022-11-18 NOTE — Discharge Instructions (Signed)
You were seen today for nausea and vomiting. Your workup was reassuring. I have prescribed you anti nausea medication.  Please schedule follow up with your PCP

## 2022-11-18 NOTE — ED Provider Notes (Signed)
[facility] EMERGENCY DEPARTMENT Provider Note   CSN: [id number] Arrival date & time: 11/18/22  1625     History Past medical history of diabetes type 2  Chief Complaint  Patient presents with   Emesis    Eric Vega is a 61 y.o. male.  Patient is presenting to the ED with 3 hours of nausea and vomiting approximately 3 episodes of nonbloody nonbilious emesis.  He has associated left upper and middle abdominal pain with pain going into his mid back. This has been constant.    He has no other associated symptoms such as fever, chills, chest pain, shortness of breath, diarrhea, constipation, dysuria, hematuria, flank pain.  He was recently discharged from the hospital yesterday for a sepsis UTI.  He received IV antibiotics including vancomycin and cefepime.  He was discharged home with 4 more days of Amoxil.   Emesis Associated symptoms: abdominal pain        Home Medications Prior to Admission medications   Medication Sig Start Date End Date Taking? Authorizing Provider  ondansetron (ZOFRAN-ODT) 8 MG disintegrating tablet Take 1 tablet (8 mg total) by mouth every 8 (eight) hours as needed for up to 7 days for nausea or vomiting. 11/18/22 11/25/22 Yes Chenae Brager, Adora Fridge, PA-C  amoxicillin (AMOXIL) 500 MG capsule Take 1 capsule (500 mg total) by mouth every 8 (eight) hours. 11/17/22   Orson Eva, MD  ferrous sulfate 325 (65 FE) MG tablet Take 1 tablet (325 mg total) by mouth daily with breakfast. 06/07/22 07/07/22  Deatra James, MD  metFORMIN (GLUCOPHAGE) 500 MG tablet Take 1 tablet (500 mg total) by mouth daily with breakfast. 06/07/22 10/05/22  Deatra James, MD      Allergies    Patient has no known allergies.    Review of Systems   Review of Systems  Gastrointestinal:  Positive for abdominal pain, nausea and vomiting.  Musculoskeletal:  Positive for back pain.  All other systems reviewed and are negative.   Physical Exam Updated Vital Signs BP 115/68    Pulse 62   Temp 98 F (36.7 C) (Oral)   Resp 20   Ht _0  (1.651 m)   Wt 46.7 kg   SpO2 100%   BMI 17.14 kg/m  Physical Exam Vitals and nursing note reviewed.  Constitutional:      General: He is not in acute distress.    Appearance: Normal appearance. He is well-developed. He is not ill-appearing, toxic-appearing or diaphoretic.  HENT:     Head: Normocephalic and atraumatic.     Nose: No nasal deformity.     Mouth/Throat:     Lips: Pink. No lesions.     Mouth: Mucous membranes are moist.     Pharynx: Oropharynx is clear. No oropharyngeal exudate or posterior oropharyngeal erythema.  Eyes:     General: Gaze aligned appropriately. No scleral icterus.       Right eye: No discharge.        Left eye: No discharge.     Conjunctiva/sclera: Conjunctivae normal.     Right eye: Right conjunctiva is not injected. No exudate or hemorrhage.    Left eye: Left conjunctiva is not injected. No exudate or hemorrhage. Cardiovascular:     Rate and Rhythm: Normal rate and regular rhythm.     Pulses: Normal pulses.  Pulmonary:     Effort: Pulmonary effort is normal. No respiratory distress.  Abdominal:     General: Abdomen is flat. There is no distension.  Palpations: Abdomen is soft. There is no mass.     Tenderness: There is abdominal tenderness. There is no right CVA tenderness, left CVA tenderness, guarding or rebound.     Hernia: No hernia is present.     Comments: Mild epigastric/LUQ tenderness  Skin:    General: Skin is warm and dry.     Coloration: Skin is not jaundiced.  Neurological:     Mental Status: He is alert and oriented to person, place, and time.  Psychiatric:        Mood and Affect: Mood normal.        Speech: Speech normal.        Behavior: Behavior normal. Behavior is cooperative.     ED Results / Procedures / Treatments   Labs (all labs ordered are listed, but only abnormal results are displayed) Labs Reviewed  CBC WITH DIFFERENTIAL/PLATELET - Abnormal;  Notable for the following components:      Result Value   RBC 3.77 (*)    Hemoglobin 10.8 (*)    HCT 32.8 (*)    All other components within normal limits  COMPREHENSIVE METABOLIC PANEL - Abnormal; Notable for the following components:   Sodium 131 (*)    Chloride 94 (*)    Glucose, Bld 111 (*)    Calcium 8.2 (*)    AST 101 (*)    ALT 77 (*)    All other components within normal limits  CBG MONITORING, ED - Abnormal; Notable for the following components:   Glucose-Capillary 131 (*)    All other components within normal limits  LIPASE, BLOOD  URINALYSIS, ROUTINE W REFLEX MICROSCOPIC    EKG None  Radiology No results found.  Procedures Procedures   Medications Ordered in ED Medications  alum & mag hydroxide-simeth (MAALOX/MYLANTA) 200-200-20 MG/5ML suspension 30 mL (has no administration in time range)  famotidine (PEPCID) tablet 20 mg (has no administration in time range)  sodium chloride 0.9 % bolus 1,000 mL (1,000 mLs Intravenous New Bag/Given 11/18/22 1915)  ondansetron (ZOFRAN) injection 4 mg (4 mg Intravenous Given 11/18/22 1918)    ED Course/ Medical Decision Making/ A&P                           Medical Decision Making Amount and/or Complexity of Data Reviewed Labs: ordered.  Risk OTC drugs. Prescription drug management.    MDM  This is a 61 y.o. male who presents to the ED with upper abdominal pain, nausea, and vomiting for 3 hours The differential of this patient includes but is not limited to DKA, bowel obstruction, PUD, GERD, pancreatitis, biliary pathology, cyclic vomiting syndrome.  Initial Impression  He is well-appearing in no acute distress with no active vomiting. He is afebrile and has normal vitals.  He is not septic appearing. Abdomen is soft with mild left upper quadrant tenderness but overall benign I have ordered basic labs and a lipase Will treat with IVF, GI cocktail, and Zofran  I personally ordered, reviewed, and interpreted all  laboratory work and imaging and agree with radiologist interpretation. Results interpreted below:  No leukocytosis, hemoglobin 10.8 which is stable from prior, renal function normal, mild elevation in AST and ALT without notable alk phos or T. bili elevation, glucose is 111 without acidosis or anion gap.  Assessment/Plan:  Workup notable for mild transaminitis.  Labs are not consistent with biliary pathology.  I did reassess his abdomen there is no right upper quadrant tenderness  or Murphy sign.  Suspect this could be secondary to recent sepsis admission possibly.  I do not feel that he needs any further CT imaging of his abdomen at this time as repeat exam was overall unremarkable.  He has had no further episodes of vomiting since he has been here and has tolerated p.o.  Will prescribe him Zofran for home.  He is stable for discharge with follow-up with PCP.   Charting Requirements Additional history is obtained from:  Independent historian External Records from outside source obtained and reviewed including: recent admission note Social Determinants of Health:  none Pertinant PMH that complicates patient's illness: Diabetes type 2  Patient Care Problems that were addressed during this visit: - Nausea and Vomiting: Acute illness with systemic symptoms This patient was maintained on a cardiac monitor/telemetry. I personally viewed and interpreted the cardiac monitor which reveals an underlying rhythm of NSR Medications given in ED: Zofran, 1 L IVF, GI cocktail Reevaluation of the patient after these medicines showed that the patient improved I have reviewed home medications and made changes accordingly.  Critical Care Interventions: n/a Consultations: n/a Disposition: discharge  This is a supervised visit with my attending physician, Dr. Sabra Heck. We have discussed this patient and they have altered the plan as needed.  Portions of this note were generated with Lobbyist.  Dictation errors may occur despite best attempts at proofreading.    Final Clinical Impression(s) / ED Diagnoses Final diagnoses:  Nausea and vomiting, unspecified vomiting type    Rx / DC Orders ED Discharge Orders          Ordered    ondansetron (ZOFRAN-ODT) 8 MG disintegrating tablet  Every 8 hours PRN        11/18/22 1951              Sheila Oats 11/18/22 Lona Kettle    Noemi Chapel, MD 11/19/22 [phone]

## 2023-02-19 ENCOUNTER — Encounter: Payer: Self-pay | Admitting: *Deleted

## 2023-02-23 ENCOUNTER — Encounter (HOSPITAL_COMMUNITY): Payer: Self-pay

## 2023-02-23 ENCOUNTER — Other Ambulatory Visit: Payer: Self-pay

## 2023-02-23 ENCOUNTER — Emergency Department (HOSPITAL_COMMUNITY)
Admission: EM | Admit: 2023-02-23 | Discharge: 2023-02-23 | Discharge status: Against Medical Advice | Payer: Medicaid Other | Attending: Emergency Medicine | Admitting: Emergency Medicine

## 2023-02-23 DIAGNOSIS — Z5321 Procedure and treatment not carried out due to patient leaving prior to being seen by health care provider: Secondary | ICD-10-CM | POA: Diagnosis not present

## 2023-02-23 DIAGNOSIS — M546 Pain in thoracic spine: Secondary | ICD-10-CM | POA: Diagnosis present

## 2023-02-23 DIAGNOSIS — R7309 Other abnormal glucose: Secondary | ICD-10-CM | POA: Insufficient documentation

## 2023-02-23 DIAGNOSIS — L02212 Cutaneous abscess of back [any part, except buttock]: Secondary | ICD-10-CM | POA: Diagnosis not present

## 2023-02-23 DIAGNOSIS — M549 Dorsalgia, unspecified: Secondary | ICD-10-CM

## 2023-02-23 LAB — CBG MONITORING, ED: Glucose-Capillary: 134 mg/dL — ABNORMAL HIGH (ref 70–99)

## 2023-02-23 NOTE — ED Triage Notes (Signed)
Patient had abscess on back lanced 3 weeks ago.  Abscess has returned and complains of upper back pain where the abscess is located.

## 2023-02-23 NOTE — ED Provider Notes (Signed)
Patient was not seen as he eloped before being evaluated.   Elvina Sidle 02/23/23 1620    Cristie Hem, MD 02/23/23 2352

## 2023-03-01 ENCOUNTER — Encounter: Payer: Self-pay | Admitting: *Deleted

## 2023-03-22 ENCOUNTER — Other Ambulatory Visit: Payer: Self-pay | Admitting: *Deleted

## 2023-03-22 DIAGNOSIS — L02212 Cutaneous abscess of back [any part, except buttock]: Secondary | ICD-10-CM

## 2023-03-23 ENCOUNTER — Ambulatory Visit: Payer: Medicaid Other | Admitting: General Surgery

## 2023-04-13 ENCOUNTER — Encounter (INDEPENDENT_AMBULATORY_CARE_PROVIDER_SITE_OTHER): Payer: Self-pay | Admitting: *Deleted

## 2023-04-23 ENCOUNTER — Emergency Department (HOSPITAL_COMMUNITY): Payer: Medicaid Other

## 2023-04-23 ENCOUNTER — Encounter (HOSPITAL_COMMUNITY): Payer: Self-pay | Admitting: Emergency Medicine

## 2023-04-23 ENCOUNTER — Observation Stay (HOSPITAL_COMMUNITY)
Admission: EM | Admit: 2023-04-23 | Discharge: 2023-04-25 | Disposition: A | Discharge status: Home / Self Care | Payer: Medicaid Other | Attending: Internal Medicine | Admitting: Internal Medicine

## 2023-04-23 ENCOUNTER — Other Ambulatory Visit: Payer: Self-pay

## 2023-04-23 DIAGNOSIS — R6883 Chills (without fever): Secondary | ICD-10-CM | POA: Diagnosis present

## 2023-04-23 DIAGNOSIS — R1310 Dysphagia, unspecified: Secondary | ICD-10-CM

## 2023-04-23 DIAGNOSIS — R6510 Systemic inflammatory response syndrome (SIRS) of non-infectious origin without acute organ dysfunction: Secondary | ICD-10-CM | POA: Diagnosis not present

## 2023-04-23 DIAGNOSIS — Z79899 Other long term (current) drug therapy: Secondary | ICD-10-CM | POA: Diagnosis not present

## 2023-04-23 DIAGNOSIS — J209 Acute bronchitis, unspecified: Secondary | ICD-10-CM | POA: Insufficient documentation

## 2023-04-23 DIAGNOSIS — E119 Type 2 diabetes mellitus without complications: Secondary | ICD-10-CM | POA: Diagnosis not present

## 2023-04-23 DIAGNOSIS — E1165 Type 2 diabetes mellitus with hyperglycemia: Secondary | ICD-10-CM | POA: Diagnosis present

## 2023-04-23 DIAGNOSIS — Z7984 Long term (current) use of oral hypoglycemic drugs: Secondary | ICD-10-CM | POA: Insufficient documentation

## 2023-04-23 DIAGNOSIS — I10 Essential (primary) hypertension: Secondary | ICD-10-CM | POA: Diagnosis not present

## 2023-04-23 DIAGNOSIS — E559 Vitamin D deficiency, unspecified: Secondary | ICD-10-CM | POA: Insufficient documentation

## 2023-04-23 DIAGNOSIS — A419 Sepsis, unspecified organism: Secondary | ICD-10-CM

## 2023-04-23 DIAGNOSIS — J40 Bronchitis, not specified as acute or chronic: Secondary | ICD-10-CM

## 2023-04-23 DIAGNOSIS — E876 Hypokalemia: Secondary | ICD-10-CM | POA: Diagnosis not present

## 2023-04-23 DIAGNOSIS — R768 Other specified abnormal immunological findings in serum: Secondary | ICD-10-CM | POA: Insufficient documentation

## 2023-04-23 DIAGNOSIS — E1169 Type 2 diabetes mellitus with other specified complication: Secondary | ICD-10-CM

## 2023-04-23 DIAGNOSIS — Z1152 Encounter for screening for COVID-19: Secondary | ICD-10-CM | POA: Insufficient documentation

## 2023-04-23 DIAGNOSIS — Z9181 History of falling: Secondary | ICD-10-CM | POA: Insufficient documentation

## 2023-04-23 LAB — COMPREHENSIVE METABOLIC PANEL
ALT: 50 U/L — ABNORMAL HIGH (ref 0–44)
AST: 56 U/L — ABNORMAL HIGH (ref 15–41)
Albumin: 3.6 g/dL (ref 3.5–5.0)
Alkaline Phosphatase: 96 U/L (ref 38–126)
Anion gap: 7 (ref 5–15)
BUN: 22 mg/dL — ABNORMAL HIGH (ref 6–20)
CO2: 25 mmol/L (ref 22–32)
Calcium: 8.2 mg/dL — ABNORMAL LOW (ref 8.9–10.3)
Chloride: 100 mmol/L (ref 98–111)
Creatinine, Ser: 0.83 mg/dL (ref 0.61–1.24)
GFR, Estimated: >60 mL/min (ref >60)
Glucose, Bld: 118 mg/dL — ABNORMAL HIGH (ref 70–99)
Potassium: 3.4 mmol/L — ABNORMAL LOW (ref 3.5–5.1)
Sodium: 132 mmol/L — ABNORMAL LOW (ref 135–145)
Total Bilirubin: 0.6 mg/dL (ref 0.3–1.2)
Total Protein: 6.5 g/dL (ref 6.5–8.1)

## 2023-04-23 LAB — CBC WITH DIFFERENTIAL/PLATELET
Abs Immature Granulocytes: 0.05 10*3/uL (ref 0.00–0.07)
Basophils Absolute: 0 10*3/uL (ref 0.0–0.1)
Basophils Relative: 0 %
Eosinophils Absolute: 0.2 10*3/uL (ref 0.0–0.5)
Eosinophils Relative: 2 %
HCT: 31.8 % — ABNORMAL LOW (ref 39.0–52.0)
Hemoglobin: 10.7 g/dL — ABNORMAL LOW (ref 13.0–17.0)
Immature Granulocytes: 0 %
Lymphocytes Relative: 3 %
Lymphs Abs: 0.4 10*3/uL — ABNORMAL LOW (ref 0.7–4.0)
MCH: 28.6 pg (ref 26.0–34.0)
MCHC: 33.6 g/dL (ref 30.0–36.0)
MCV: 85 fL (ref 80.0–100.0)
Monocytes Absolute: 0.7 10*3/uL (ref 0.1–1.0)
Monocytes Relative: 6 %
Neutro Abs: 10.3 10*3/uL — ABNORMAL HIGH (ref 1.7–7.7)
Neutrophils Relative %: 89 %
Platelets: 175 10*3/uL (ref 150–400)
RBC: 3.74 MIL/uL — ABNORMAL LOW (ref 4.22–5.81)
RDW: 13.3 % (ref 11.5–15.5)
WBC: 11.7 10*3/uL — ABNORMAL HIGH (ref 4.0–10.5)
nRBC: 0 % (ref 0.0–0.2)

## 2023-04-23 LAB — PROTIME-INR
INR: 1.3 — ABNORMAL HIGH (ref 0.8–1.2)
Prothrombin Time: 16.4 seconds — ABNORMAL HIGH (ref 11.4–15.2)

## 2023-04-23 LAB — RESP PANEL BY RT-PCR (RSV, FLU A&B, COVID)  RVPGX2
Influenza A by PCR: NEGATIVE
Influenza B by PCR: NEGATIVE
Resp Syncytial Virus by PCR: NEGATIVE
SARS Coronavirus 2 by RT PCR: NEGATIVE

## 2023-04-23 LAB — LACTIC ACID, PLASMA
Lactic Acid, Venous: 0.8 mmol/L (ref 0.5–1.9)
Lactic Acid, Venous: 0.8 mmol/L (ref 0.5–1.9)

## 2023-04-23 LAB — APTT: aPTT: 41 seconds — ABNORMAL HIGH (ref 24–36)

## 2023-04-23 MED ORDER — VANCOMYCIN HCL IN DEXTROSE 1-5 GM/200ML-% IV SOLN
1000.0000 mg | Freq: Once | INTRAVENOUS | Status: AC
  Administered 2023-04-23: 1000 mg via INTRAVENOUS
  Filled 2023-04-23: qty 200

## 2023-04-23 MED ORDER — SODIUM CHLORIDE 0.9 % IV SOLN
2.0000 g | Freq: Once | INTRAVENOUS | Status: AC
  Administered 2023-04-23: 2 g via INTRAVENOUS
  Filled 2023-04-23: qty 12.5

## 2023-04-23 MED ORDER — SODIUM CHLORIDE 0.9 % IV BOLUS (SEPSIS)
1000.0000 mL | Freq: Once | INTRAVENOUS | Status: AC
  Administered 2023-04-23: 1000 mL via INTRAVENOUS

## 2023-04-23 MED ORDER — SODIUM CHLORIDE 0.9 % IV BOLUS
1000.0000 mL | Freq: Once | INTRAVENOUS | Status: AC
  Administered 2023-04-24: 1000 mL via INTRAVENOUS

## 2023-04-23 MED ORDER — LACTATED RINGERS IV BOLUS: 1000.0000 mL | Freq: Once | INTRAVENOUS | Status: DC

## 2023-04-23 MED ORDER — ACETAMINOPHEN 325 MG PO TABS
650.0000 mg | ORAL_TABLET | Freq: Once | ORAL | Status: AC
  Administered 2023-04-23: 650 mg via ORAL
  Filled 2023-04-23: qty 2

## 2023-04-23 MED ORDER — VANCOMYCIN HCL 750 MG/150ML IV SOLN
750.0000 mg | Freq: Two times a day (BID) | INTRAVENOUS | Status: DC
  Administered 2023-04-24 (×2): 750 mg via INTRAVENOUS
  Filled 2023-04-23 (×4): qty 150

## 2023-04-23 MED ORDER — SODIUM CHLORIDE 0.9 % IV BOLUS
1000.0000 mL | Freq: Once | INTRAVENOUS | Status: AC
  Administered 2023-04-23: 1000 mL via INTRAVENOUS

## 2023-04-23 MED ORDER — IOHEXOL 300 MG/ML  SOLN
100.0000 mL | Freq: Once | INTRAMUSCULAR | Status: AC | PRN
  Administered 2023-04-23: 100 mL via INTRAVENOUS

## 2023-04-23 MED ORDER — SODIUM CHLORIDE 0.9 % IV SOLN
2.0000 g | Freq: Three times a day (TID) | INTRAVENOUS | Status: DC
  Administered 2023-04-24 – 2023-04-25 (×4): 2 g via INTRAVENOUS
  Filled 2023-04-23 (×4): qty 12.5

## 2023-04-23 MED ORDER — VANCOMYCIN HCL 750 MG/150ML IV SOLN
750.0000 mg | Freq: Two times a day (BID) | INTRAVENOUS | Status: DC
  Filled 2023-04-23: qty 150

## 2023-04-23 MED ORDER — LACTATED RINGERS IV SOLN: INTRAVENOUS | Status: AC

## 2023-04-23 NOTE — ED Provider Notes (Incomplete)
[place] EMERGENCY DEPARTMENT AT [facility] Provider Note   CSN: [id number] Arrival date & time: 04/23/23  1925     History {Add pertinent medical, surgical, social history, OB history to HPI:1} Chief Complaint  Patient presents with  . Chills    Eric Vega is a 62 y.o. male.  He has past medical history of hypertension, diabetes.  He is Spanish-speaking and interpreter was used for the evaluation.  He is here with a friend who also helps with history.  Patient states around 4 PM today he was sitting in front of a fan because he was hot in the office and got very cold and chilled.  His friends states when he came to pick him up he was complaining of pain in his bones all over and feeling very cold.  He got some Tylenol at triage and now is feeling somewhat better.  He did vomit today as well.  Denies abdominal pain, no chest pain or shortness of breath, no headache, no cough, no other complaints.  HPI     Home Medications Prior to Admission medications   Medication Sig Start Date End Date Taking? Authorizing Provider  amoxicillin (AMOXIL) 500 MG capsule Take 1 capsule (500 mg total) by mouth every 8 (eight) hours. 11/17/22   Catarina Hartshorn, MD  ferrous sulfate 325 (65 FE) MG tablet Take 1 tablet (325 mg total) by mouth daily with breakfast. 06/07/22 07/07/22  Kendell Bane, MD  metFORMIN (GLUCOPHAGE) 500 MG tablet Take 1 tablet (500 mg total) by mouth daily with breakfast. 06/07/22 10/05/22  Kendell Bane, MD      Allergies    Patient has no known allergies.    Review of Systems   Review of Systems  Physical Exam Updated Vital Signs BP (!) 79/38   Pulse 90   Temp (!) 101 F (38.3 C) (Rectal)   Resp (!) 21   Ht 5\' 5"  (1.651 m)   Wt 61.7 kg   SpO2 99%   BMI 22.63 kg/m  Physical Exam Vitals and nursing note reviewed.  Constitutional:      General: He is not in acute distress.    Appearance: He is well-developed. He is ill-appearing.  HENT:      Head: Normocephalic and atraumatic.  Eyes:     Conjunctiva/sclera: Conjunctivae normal.  Cardiovascular:     Rate and Rhythm: Normal rate and regular rhythm.     Heart sounds: No murmur heard. Pulmonary:     Effort: Pulmonary effort is normal. No respiratory distress.     Breath sounds: Normal breath sounds.  Abdominal:     Palpations: Abdomen is soft.     Tenderness: There is no abdominal tenderness.  Musculoskeletal:        General: No swelling.     Cervical back: Neck supple.  Skin:    General: Skin is warm and dry.     Capillary Refill: Capillary refill takes less than 2 seconds.  Neurological:     Mental Status: He is alert.  Psychiatric:        Mood and Affect: Mood normal.     ED Results / Procedures / Treatments   Labs (all labs ordered are listed, but only abnormal results are displayed) Labs Reviewed  COMPREHENSIVE METABOLIC PANEL - Abnormal; Notable for the following components:      Result Value   Sodium 132 (*)    Potassium 3.4 (*)    Glucose, Bld 118 (*)    BUN  22 (*)    Calcium 8.2 (*)    AST 56 (*)    ALT 50 (*)    All other components within normal limits  CBC WITH DIFFERENTIAL/PLATELET - Abnormal; Notable for the following components:   WBC 11.7 (*)    RBC 3.74 (*)    Hemoglobin 10.7 (*)    HCT 31.8 (*)    Neutro Abs 10.3 (*)    Lymphs Abs 0.4 (*)    All other components within normal limits  RESP PANEL BY RT-PCR (RSV, FLU A&B, COVID)  RVPGX2  CULTURE, BLOOD (ROUTINE X 2)  CULTURE, BLOOD (ROUTINE X 2)  LACTIC ACID, PLASMA  LACTIC ACID, PLASMA  PROTIME-INR  APTT  URINALYSIS, W/ REFLEX TO CULTURE (INFECTION SUSPECTED)    EKG None  Radiology No results found.  Procedures Procedures  {Document cardiac monitor, telemetry assessment procedure when appropriate:1}  Medications Ordered in ED Medications  lactated ringers infusion (has no administration in time range)  ceFEPIme (MAXIPIME) 2 g in sodium chloride 0.9 % 100 mL IVPB (2 g  Intravenous New Bag/Given 04/23/23 2300)  vancomycin (VANCOCIN) IVPB 1000 mg/200 mL premix (1,000 mg Intravenous New Bag/Given 04/23/23 2304)  sodium chloride 0.9 % bolus 1,000 mL (1,000 mLs Intravenous New Bag/Given 04/23/23 2257)  ceFEPIme (MAXIPIME) 2 g in sodium chloride 0.9 % 100 mL IVPB (has no administration in time range)  acetaminophen (TYLENOL) tablet 650 mg (650 mg Oral Given 04/23/23 2019)  sodium chloride 0.9 % bolus 1,000 mL (1,000 mLs Intravenous New Bag/Given 04/23/23 2242)    ED Course/ Medical Decision Making/ A&P Clinical Course as of 04/23/23 2306  Fri Apr 23, 2023  2305 Patient seen in fast-track for complaint of chills, COVID flu RSV have been ordered at triage and are negative.  Patient states symptoms started around 4 PM.  He does appear to be ill.  Labs ordered for possible sepsis.  Patient moved to another room.  Notified by nurse when patient was moved they recheck blood pressure and it was low, fluid bolus had already been ordered.  Lactic acid is normal.  Waiting for remainder of labs will start empiric antibiotics.  No obvious source.  Patient denies cough abdominal pain urinary symptoms or other complaints.  Had recent abscess on his back drained by surgery and finish antibiotics states this resolved.  This was in March. [CB]    Clinical Course User Index [CB] Carmel Sacramento A, PA-C   {   Click here for ABCD2, HEART and other calculatorsREFRESH Note before signing :1}                          Medical Decision Making Amount and/or Complexity of Data Reviewed Labs: ordered. Radiology: ordered.  Risk OTC drugs. Prescription drug management.   ***  {Document critical care time when appropriate:1} {Document review of labs and clinical decision tools ie heart score, Chads2Vasc2 etc:1}  {Document your independent review of radiology images, and any outside records:1} {Document your discussion with family members, caretakers, and with consultants:1} {Document  social determinants of health affecting pt's care:1} {Document your decision making why or why not admission, treatments were needed:1} Final Clinical Impression(s) / ED Diagnoses Final diagnoses:  None    Rx / DC Orders ED Discharge Orders     None

## 2023-04-23 NOTE — ED Triage Notes (Signed)
Spanish interpreter needed. Pt via POV with a friend who states that pt has chills, fever, pain, and dizziness. Symptoms started at around 3pm. No known sick contacts; pt took meds for his diabetes but no meds for fever or pain. Pt has generalized body aches currently rated 10/10.

## 2023-04-23 NOTE — ED Notes (Signed)
Pt to CT at this time.

## 2023-04-23 NOTE — Progress Notes (Signed)
Pharmacy Antibiotic Note  Eric Vega is a 62 y.o. male admitted on 04/23/2023 with  infection of unknown source .  Pharmacy has been consulted for cefepime and vanco dosing.  Plan: Vancomycin 1000 mg iv x1 dose f/b 750 mg IV every 12 hours.  eAUC 458 mcg*hr/mL (scr 0.83) Cefepime 2 grams iv q8h  Height: 5\' 5"  (165.1 cm) Weight: 61.7 kg (136 lb) IBW/kg (Calculated) : 61.5  Temp (24hrs), Avg:100.5 F (38.1 C), Min:99.8 F (37.7 C), Max:101 F (38.3 C)  Recent Labs  Lab 04/23/23 2213 04/23/23 2230  WBC 11.7*  --   CREATININE 0.83  --   LATICACIDVEN  --  0.8    Estimated Creatinine Clearance: 83.4 mL/min (by C-G formula based on SCr of 0.83 mg/dL).    No Known Allergies    Thank you for allowing pharmacy to be a part of this patient's care.  Greta Doom BS, PharmD, BCPS Clinical Pharmacist 04/23/2023 11:07 PM  Contact: [phone] after 3 PM  "Be curious, not judgmental..." -Debbora Dus

## 2023-04-23 NOTE — ED Provider Notes (Signed)
[place] EMERGENCY DEPARTMENT AT [facility] Provider Note   CSN: [id number] Arrival date & time: 04/23/23  1925     History {Add pertinent medical, surgical, social history, OB history to HPI:1} Chief Complaint  Patient presents with   Chills    Eric Vega is a 62 y.o. male.  He has past medical history of hypertension, diabetes.  He is Spanish-speaking and interpreter was used for the evaluation.  He is here with a friend who also helps with history.  Patient states around 4 PM today he was sitting in front of a fan because he was hot in the office and got very cold and chilled.  His friends states when he came to pick him up he was complaining of pain in his bones all over and feeling very cold.  He got some Tylenol at triage and now is feeling somewhat better.  He did vomit today as well.  Denies abdominal pain, no chest pain or shortness of breath, no headache, no cough, no other complaints.  HPI     Home Medications Prior to Admission medications   Medication Sig Start Date End Date Taking? Authorizing Provider  amoxicillin (AMOXIL) 500 MG capsule Take 1 capsule (500 mg total) by mouth every 8 (eight) hours. 11/17/22   Catarina Hartshorn, MD  ferrous sulfate 325 (65 FE) MG tablet Take 1 tablet (325 mg total) by mouth daily with breakfast. 06/07/22 07/07/22  Kendell Bane, MD  metFORMIN (GLUCOPHAGE) 500 MG tablet Take 1 tablet (500 mg total) by mouth daily with breakfast. 06/07/22 10/05/22  Kendell Bane, MD      Allergies    Patient has no known allergies.    Review of Systems   Review of Systems  Physical Exam Updated Vital Signs BP 105/62   Pulse (!) 116   Temp (!) 100.8 F (38.2 C)   Resp (!) 23   Ht 5\' 5"  (1.651 m)   Wt 61.7 kg   SpO2 93%   BMI 22.63 kg/m  Physical Exam Vitals and nursing note reviewed.  Constitutional:      General: He is not in acute distress.    Appearance: He is well-developed. He is ill-appearing.  HENT:     Head:  Normocephalic and atraumatic.  Eyes:     Conjunctiva/sclera: Conjunctivae normal.  Cardiovascular:     Rate and Rhythm: Normal rate and regular rhythm.     Heart sounds: No murmur heard. Pulmonary:     Effort: Pulmonary effort is normal. No respiratory distress.     Breath sounds: Normal breath sounds.  Abdominal:     Palpations: Abdomen is soft.     Tenderness: There is no abdominal tenderness.  Musculoskeletal:        General: No swelling.     Cervical back: Neck supple.  Skin:    General: Skin is warm and dry.     Capillary Refill: Capillary refill takes less than 2 seconds.  Neurological:     Mental Status: He is alert.  Psychiatric:        Mood and Affect: Mood normal.     ED Results / Procedures / Treatments   Labs (all labs ordered are listed, but only abnormal results are displayed) Labs Reviewed  RESP PANEL BY RT-PCR (RSV, FLU A&B, COVID)  RVPGX2  CULTURE, BLOOD (ROUTINE X 2)  CULTURE, BLOOD (ROUTINE X 2)  COMPREHENSIVE METABOLIC PANEL  CBC WITH DIFFERENTIAL/PLATELET  LACTIC ACID, PLASMA  LACTIC ACID, PLASMA  EKG None  Radiology No results found.  Procedures Procedures  {Document cardiac monitor, telemetry assessment procedure when appropriate:1}  Medications Ordered in ED Medications  sodium chloride 0.9 % bolus 1,000 mL (has no administration in time range)  acetaminophen (TYLENOL) tablet 650 mg (650 mg Oral Given 04/23/23 2019)    ED Course/ Medical Decision Making/ A&P   {   Click here for ABCD2, HEART and other calculatorsREFRESH Note before signing :1}                          Medical Decision Making Amount and/or Complexity of Data Reviewed Labs: ordered.  Risk OTC drugs.   ***  {Document critical care time when appropriate:1} {Document review of labs and clinical decision tools ie heart score, Chads2Vasc2 etc:1}  {Document your independent review of radiology images, and any outside records:1} {Document your discussion with  family members, caretakers, and with consultants:1} {Document social determinants of health affecting pt's care:1} {Document your decision making why or why not admission, treatments were needed:1} Final Clinical Impression(s) / ED Diagnoses Final diagnoses:  None    Rx / DC Orders ED Discharge Orders     None

## 2023-04-23 NOTE — ED Notes (Signed)
Using interpreter pt states he feels better after tylenol.  He reports not eating well, poor nutrition, and only eating 1 meal a day.  Pt does report he takes his blood sugars regularly and follows with his doctor regularly and has had his diabetes and blood pressure under control.  States he still feels dizzy but has no CP or shob.

## 2023-04-24 DIAGNOSIS — E876 Hypokalemia: Secondary | ICD-10-CM

## 2023-04-24 DIAGNOSIS — E1165 Type 2 diabetes mellitus with hyperglycemia: Secondary | ICD-10-CM

## 2023-04-24 DIAGNOSIS — R6510 Systemic inflammatory response syndrome (SIRS) of non-infectious origin without acute organ dysfunction: Secondary | ICD-10-CM

## 2023-04-24 LAB — URINALYSIS, W/ REFLEX TO CULTURE (INFECTION SUSPECTED)
Bacteria, UA: NONE SEEN
Bilirubin Urine: NEGATIVE
Glucose, UA: NEGATIVE mg/dL
Hgb urine dipstick: NEGATIVE
Ketones, ur: NEGATIVE mg/dL
Leukocytes,Ua: NEGATIVE
Nitrite: NEGATIVE
Protein, ur: NEGATIVE mg/dL
Specific Gravity, Urine: 1.013 (ref 1.005–1.030)
pH: 6 (ref 5.0–8.0)

## 2023-04-24 LAB — HEMOGLOBIN A1C
Hgb A1c MFr Bld: 7 % — ABNORMAL HIGH (ref 4.8–5.6)
Mean Plasma Glucose: 154.2 mg/dL

## 2023-04-24 LAB — COMPREHENSIVE METABOLIC PANEL
ALT: 40 U/L (ref 0–44)
AST: 32 U/L (ref 15–41)
Albumin: 2.9 g/dL — ABNORMAL LOW (ref 3.5–5.0)
Alkaline Phosphatase: 75 U/L (ref 38–126)
Anion gap: 4 — ABNORMAL LOW (ref 5–15)
BUN: 17 mg/dL (ref 6–20)
CO2: 22 mmol/L (ref 22–32)
Calcium: 7.5 mg/dL — ABNORMAL LOW (ref 8.9–10.3)
Chloride: 109 mmol/L (ref 98–111)
Creatinine, Ser: 0.62 mg/dL (ref 0.61–1.24)
GFR, Estimated: >60 mL/min (ref >60)
Glucose, Bld: 114 mg/dL — ABNORMAL HIGH (ref 70–99)
Potassium: 4.1 mmol/L (ref 3.5–5.1)
Sodium: 135 mmol/L (ref 135–145)
Total Bilirubin: 0.6 mg/dL (ref 0.3–1.2)
Total Protein: 5.4 g/dL — ABNORMAL LOW (ref 6.5–8.1)

## 2023-04-24 LAB — CBC WITH DIFFERENTIAL/PLATELET
Abs Immature Granulocytes: 0.06 10*3/uL (ref 0.00–0.07)
Basophils Absolute: 0.1 10*3/uL (ref 0.0–0.1)
Basophils Relative: 1 %
Eosinophils Absolute: 0.1 10*3/uL (ref 0.0–0.5)
Eosinophils Relative: 1 %
HCT: 32.5 % — ABNORMAL LOW (ref 39.0–52.0)
Hemoglobin: 10.8 g/dL — ABNORMAL LOW (ref 13.0–17.0)
Immature Granulocytes: 1 %
Lymphocytes Relative: 8 %
Lymphs Abs: 1.1 10*3/uL (ref 0.7–4.0)
MCH: 29.2 pg (ref 26.0–34.0)
MCHC: 33.2 g/dL (ref 30.0–36.0)
MCV: 87.8 fL (ref 80.0–100.0)
Monocytes Absolute: 0.7 10*3/uL (ref 0.1–1.0)
Monocytes Relative: 5 %
Neutro Abs: 10.9 10*3/uL — ABNORMAL HIGH (ref 1.7–7.7)
Neutrophils Relative %: 84 %
Platelets: 173 10*3/uL (ref 150–400)
RBC: 3.7 MIL/uL — ABNORMAL LOW (ref 4.22–5.81)
RDW: 13.4 % (ref 11.5–15.5)
WBC: 12.9 10*3/uL — ABNORMAL HIGH (ref 4.0–10.5)
nRBC: 0 % (ref 0.0–0.2)

## 2023-04-24 LAB — GLUCOSE, CAPILLARY
Glucose-Capillary: 79 mg/dL (ref 70–99)
Glucose-Capillary: 98 mg/dL (ref 70–99)
Glucose-Capillary: 164 mg/dL — ABNORMAL HIGH (ref 70–99)
Glucose-Capillary: 63 mg/dL — ABNORMAL LOW (ref 70–99)
Glucose-Capillary: 110 mg/dL — ABNORMAL HIGH (ref 70–99)

## 2023-04-24 LAB — CORTISOL-AM, BLOOD: Cortisol - AM: 8.1 ug/dL (ref 6.7–22.6)

## 2023-04-24 LAB — PROTIME-INR
INR: 1.3 — ABNORMAL HIGH (ref 0.8–1.2)
Prothrombin Time: 16 seconds — ABNORMAL HIGH (ref 11.4–15.2)

## 2023-04-24 LAB — PROCALCITONIN
Procalcitonin: 1.71 ng/mL
Procalcitonin: 3.05 ng/mL

## 2023-04-24 LAB — MAGNESIUM: Magnesium: 1.7 mg/dL (ref 1.7–2.4)

## 2023-04-24 MED ORDER — ONDANSETRON HCL 4 MG/2ML IJ SOLN: 4.0000 mg | Freq: Four times a day (QID) | INTRAMUSCULAR | Status: DC | PRN

## 2023-04-24 MED ORDER — ENSURE ENLIVE PO LIQD
237.0000 mL | Freq: Two times a day (BID) | ORAL | Status: DC
  Administered 2023-04-24 – 2023-04-25 (×2): 237 mL via ORAL

## 2023-04-24 MED ORDER — ACETAMINOPHEN 650 MG RE SUPP: 650.0000 mg | Freq: Four times a day (QID) | RECTAL | Status: DC | PRN

## 2023-04-24 MED ORDER — INSULIN ASPART 100 UNIT/ML IJ SOLN
0.0000 [IU] | Freq: Three times a day (TID) | INTRAMUSCULAR | Status: DC
  Administered 2023-04-24: 3 [IU] via SUBCUTANEOUS

## 2023-04-24 MED ORDER — OXYCODONE HCL 5 MG PO TABS
5.0000 mg | ORAL_TABLET | ORAL | Status: DC | PRN
  Administered 2023-04-24: 5 mg via ORAL
  Filled 2023-04-24: qty 1

## 2023-04-24 MED ORDER — POTASSIUM CHLORIDE 20 MEQ PO PACK
40.0000 meq | PACK | Freq: Once | ORAL | Status: AC
  Administered 2023-04-24: 40 meq via ORAL
  Filled 2023-04-24: qty 2

## 2023-04-24 MED ORDER — ONDANSETRON HCL 4 MG PO TABS: 4.0000 mg | ORAL_TABLET | Freq: Four times a day (QID) | ORAL | Status: DC | PRN

## 2023-04-24 MED ORDER — INSULIN ASPART 100 UNIT/ML IJ SOLN: 0.0000 [IU] | Freq: Every day | INTRAMUSCULAR | Status: DC

## 2023-04-24 MED ORDER — ACETAMINOPHEN 325 MG PO TABS: 650.0000 mg | ORAL_TABLET | Freq: Four times a day (QID) | ORAL | Status: DC | PRN

## 2023-04-24 MED ORDER — HEPARIN SODIUM (PORCINE) 5000 UNIT/ML IJ SOLN
5000.0000 [IU] | Freq: Three times a day (TID) | INTRAMUSCULAR | Status: DC
  Administered 2023-04-24 – 2023-04-25 (×3): 5000 [IU] via SUBCUTANEOUS
  Filled 2023-04-24 (×5): qty 1

## 2023-04-24 NOTE — ED Notes (Signed)
Urine collection attempted with use of interpreter, explained need for urine and in and out cath, pt refuses, attempted to use urinal with no output, EDP aware

## 2023-04-24 NOTE — ED Notes (Signed)
Call placed to [phone] to advise bringing pt to floor

## 2023-04-24 NOTE — Discharge Instructions (Addendum)
Community Resources  Agency Name: [facility] Dept. of Social Services  Address: [address], [place], [place] [address]  Phone: [phone] Website: https://ingram.com/ Service(s) Offered: Temporary financial assistance, food stamps, child care subsidies Agency Name: [employer] representative of [employer] - also serving [place] Address: [address], [place], [place] [address] Phone: [phone] Website: http://fisher.org/ Services Offered: Civil Service fast streamer, food pantry, soup kitchen Psychologist, counselling) emergency financial  assistance, thrift stores, showers & hygiene products Jonita Albee),   Agency Name: [facility] Dept. of Social Services  Address: [address], [place], [place] [address]  Phone: [phone] Website: https://ingram.com/ Service(s) Offered: Temporary financial assistance, utility assistance Agency Name: [facility] [employer] November 19, 2020 9 Address: [address], [place] [address] Phone: [phone] Website: www.[place].org Service(s) Offered: Civil Service fast streamer & household goods, food, utility assistance

## 2023-04-24 NOTE — Sepsis Progress Note (Signed)
Following for sepsis monitoring ?

## 2023-04-24 NOTE — H&P (Signed)
History and Physical    Patient: Eric Vega [name]:[id number] DOB: 12/28/1960 DOA: 04/23/2023 DOS: the patient was seen and examined on 04/24/2023 PCP: Shelby Dubin, FNP  Patient coming from: Home  Chief Complaint:  Chief Complaint  Patient presents with   Chills   HPI: Eric Vega is a 62 y.o. 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  Patient is a very poor historian.  He reports that he came in because he just has the flu.  He denies cough, headache, diarrhea, abdominal pain, dysuria, rashes, sores.  He then again says he just wants to let me know the only thing he had was the flu.  He does report that he had a fever yesterday.  It was sudden onset.  He reports myalgias associated with a fever.  He did not feel short of breath.  They do have him on oxygen at the time of my exam on the floor.  There are no documented hypoxic O2 sats.  Patient reports that he did have nausea and he had 1 episode of vomiting.  It was nonbloody emesis.  He denies any other symptoms at this time.  An interpreter was used for this encounter as patient is primarily Spanish-speaking.  Patient has no other complaints at this time.  Patient does not smoke, does not drink.  He was vaccinated for COVID.  Patient is full code. Review of Systems: As mentioned in the history of present illness. All other systems reviewed and are negative. Past Medical History:  Diagnosis Date   Acute febrile illness 11/14/2022   Acute hypoxemic respiratory failure (HCC) 06/03/2022   Acute respiratory failure with hypoxia and hypercapnia (HCC) 06/03/2020   Closed fracture of multiple ribs of both sides 06/03/2020   Closed fracture of shaft of left femur with delayed healing 06/03/2020   Critical polytrauma 06/03/2020   Diabetes mellitus without  complication (HCC)    Difficulty liberating from mechanical ventilation (HCC) 06/03/2020   Hemothorax with pneumothorax, traumatic 06/03/2020   Hypoalbuminemia due to protein-calorie malnutrition (HCC) 11/14/2022   Hypomagnesemia 06/04/2022   Hyponatremia 06/04/2022   Major depressive disorder with single episode, 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   Malnutrition of moderate degree 06/05/2022   Normocytic anemia 11/14/2022   Pericardial effusion 06/03/2020   Sepsis due to Enterococcus (HCC) 11/17/2022   Sepsis without acute organ dysfunction (HCC) 06/04/2022   SIRS (systemic inflammatory response syndrome) (HCC) 11/14/2022   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/03/2020   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 He has never used smokeless tobacco. He reports that he does not currently use alcohol. He reports that he does not currently use drugs.  No Known Allergies  History reviewed. No pertinent family history.  Prior to Admission medications   Medication Sig Start Date End Date Taking? Authorizing Provider  amoxicillin (AMOXIL) 500 MG capsule Take 1 capsule (500 mg total) by mouth every 8 (eight) hours. 11/17/22   Catarina Hartshorn, MD  ferrous sulfate 325 (65 FE) MG tablet  Take 1 tablet (325 mg total) by mouth daily with breakfast. 06/07/22 07/07/22  Kendell Bane, MD  metFORMIN (GLUCOPHAGE) 500 MG tablet Take 1 tablet (500 mg total) by mouth daily with breakfast. 06/07/22 10/05/22  Kendell Bane, MD    Physical Exam: Vitals:   04/24/23 0131 04/24/23 0205 04/24/23 0205 04/24/23 0244  BP:  (!) 86/58  (!) 103/48  Pulse: 76 74  72   Resp: 16 17  18   Temp:   97.6 F (36.4 C) (!) 97.5 F (36.4 C)  TempSrc:   Oral Oral  SpO2: 100% 100%  100%  Weight:      Height:       1.  General: Patient lying supine in bed,  no acute distress   2. Psychiatric: Alert and oriented x 3, mood and behavior normal for situation, pleasant and cooperative with exam   3. Neurologic: Speech and language are normal, face is symmetric, moves all 4 extremities voluntarily, at baseline without acute deficits on limited exam   4. HEENMT:  Head is atraumatic, normocephalic, pupils reactive to light, neck is supple, trachea is midline, mucous membranes are moist   5. Respiratory : Lungs are clear to auscultation bilaterally without wheezing, rhonchi, rales, no cyanosis, no increase in work of breathing or accessory muscle use   6. Cardiovascular : Heart rate normal, rhythm is regular, no murmurs, rubs or gallops, no peripheral edema, peripheral pulses palpated   7. Gastrointestinal:  Abdomen is soft, nondistended, nontender to palpation bowel sounds active, no masses or organomegaly palpated   8. Skin:  Skin is warm, dry and intact without rashes, acute lesions, or ulcers on limited exam   9.Musculoskeletal:  No acute deformities or trauma, no asymmetry in tone, no peripheral edema, peripheral pulses palpated, no tenderness to palpation in the extremities  Data Reviewed: In the ED Febrile at 101, tachycardic up to 116, tachypneic at 34, endorgan damage demonstrated by hypotension with a blood pressure of 79/38, satting 93-100% Negative COVID and flu Lactic acid only 0.8 Blood cultures pending Chest x-ray shows low lung volumes without acute changes EKG shows a heart rate of 90, sinus rhythm, QTc 478 UA is not indicative of UTI CT chest abdomen pelvis shows no acute infectious process  Patient had a similar admission in December 2023 where he presented for chills rigors and myalgias.  At that time his sepsis was found to be  secondary to UTI.  He had a negative COVID.  He was started on vancomycin and cefepime until culture data was available. Admission requested for sepsis secondary to unknown source  Assessment and Plan: * SIRS (systemic inflammatory response syndrome) (HCC) - Febrile at 101, tachycardic at 116, tachypneic at 34, hypotension at 79/38 Negative COVID and flu - Blood culture pending - Chest x-ray shows low lung volumes without acute cardiopulmonary disease - UA is not indicative of UTI - No observed or reported sores or rashes - CT chest abdomen pelvis shows no acute abnormality in the abdomen or pelvis, old rib fractures in the chest - Pro-Cal positive at 1.71 - Patient was given 3 L bolus - Continue vancomycin and cefepime - Continue to monitor - Vancomycin, cefepime, Flagyl started in the ED  Hypokalemia - Potassium 3.4 - Replace with 40 mEq of potassium at admission - Trend in the a.m. - Monitor on telemetry  Type 2 diabetes mellitus with hyperglycemia (HCC) - Glucose well-controlled on Camitta 118 - Holding metformin - Sliding scale coverage - Continue to  monitor      Advance Care Planning:   Code Status: Full Code  Consults: None at this time  Family Communication: No family at bedside  Severity of Illness: The appropriate patient status for this patient is OBSERVATION. Observation status is judged to be reasonable and necessary in order to provide the required intensity of service to ensure the patient's safety. The patient's presenting symptoms, physical exam findings, and initial radiographic and laboratory data in the context of their medical condition is felt to place them at decreased risk for further clinical deterioration. Furthermore, it is anticipated that the patient will be medically stable for discharge from the hospital within 2 midnights of admission.   Author: Lilyan Gilford, DO 04/24/2023 5:31 AM  For on call review [email or web address].

## 2023-04-24 NOTE — TOC Progression Note (Addendum)
  Transition of Care [facility]) Screening Note   Patient Details  Name: Eric Vega Date of Birth: 13-Aug-1961   Transition of Care [facility]) CM/SW Contact:    Elliot Gault, LCSW Phone Number: 04/24/2023, 10:33 AM    SDOH resource information added to AVS.  Transition of Care Department [facility]) has reviewed patient and no TOC needs have been identified at this time. We will continue to monitor patient advancement through interdisciplinary progression rounds. If new patient transition needs arise, please place a TOC consult.

## 2023-04-24 NOTE — ED Notes (Signed)
ED TO INPATIENT HANDOFF REPORT  ED Nurse Name and Phone #:   S Name/Age/Gender Eric Vega 62 y.o. male Room/Bed: APA05/APA05  Code Status   Code Status: Prior  Home/SNF/Other Home Patient oriented to: self, place, time, and situation Is this baseline? Yes   Triage Complete: Triage complete  Chief Complaint SIRS (systemic inflammatory response syndrome) (HCC) [R65.10]  Triage Note Spanish interpreter needed. Pt via POV with a friend who states that pt has chills, fever, pain, and dizziness. Symptoms started at around 3pm. No known sick contacts; pt took meds for his diabetes but no meds for fever or pain. Pt has generalized body aches currently rated 10/10.    Allergies No Known Allergies  Level of Care/Admitting Diagnosis ED Disposition     ED Disposition  Admit   Condition  --   Comment  Hospital Area: [facility] [[id number]]  Level of Care: Telemetry [5]  Covid Evaluation: Asymptomatic - no recent exposure (last 10 days) testing not required  Diagnosis: SIRS (systemic inflammatory response syndrome) [facility]) [[id number]]  Admitting Physician: Lilyan Gilford [[id number]]  Attending Physician: Lilyan Gilford [[id number]]          B Medical/Surgery History Past Medical History:  Diagnosis Date   Acute febrile illness 11/14/2022   Acute hypoxemic respiratory failure (HCC) 06/03/2022   Acute respiratory failure with hypoxia and hypercapnia (HCC) 06/03/2020   Closed fracture of multiple ribs of both sides 06/03/2020   Closed fracture of shaft of left femur with delayed healing 06/03/2020   Critical polytrauma 06/03/2020   Diabetes mellitus without complication (HCC)    Difficulty liberating from mechanical ventilation (HCC) 06/03/2020   Hemothorax with pneumothorax, traumatic 06/03/2020   Hypoalbuminemia due to protein-calorie malnutrition (HCC) 11/14/2022   Hypomagnesemia 06/04/2022   Hyponatremia 06/04/2022   Major depressive disorder with  single episode, 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   Malnutrition of moderate degree 06/05/2022   Normocytic anemia 11/14/2022   Pericardial effusion 06/03/2020   Sepsis due to Enterococcus (HCC) 11/17/2022   Sepsis without acute organ dysfunction (HCC) 06/04/2022   SIRS (systemic inflammatory response syndrome) (HCC) 11/14/2022   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/03/2020   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   Type 2 diabetes mellitus with hyperglycemia (HCC) 11/14/2022   UTI (urinary tract infection) 11/17/2022   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   Past Surgical History:  Procedure Laterality Date   FRACTURE SURGERY     LUNG TRANSPLANT Left    PLEURAL SCARIFICATION Left      A IV Location/Drains/Wounds Patient Lines/Drains/Airways Status     Active Line/Drains/Airways     Name Placement date Placement time Site Days   Peripheral IV 04/23/23 20 G 1" Right Antecubital 04/23/23  2242  Antecubital  1   Peripheral IV 04/23/23 20 G 1" Left Forearm 04/23/23  2254  Forearm  1            Intake/Output Last 24 hours No intake or output data in the 24 hours ending 04/24/23 0144  Labs/Imaging Results for orders placed or performed during the hospital encounter of 04/23/23 (from the past 48 hour(s))  Resp panel by RT-PCR (RSV, Flu A&B, Covid) Anterior Nasal Swab     Status: None   Collection Time: 04/23/23  7:55 PM   Specimen: Anterior Nasal Swab  Result Value Ref Range   SARS Coronavirus 2 by RT PCR NEGATIVE NEGATIVE    Comment: (NOTE) SARS-CoV-2 target nucleic acids are NOT DETECTED.  The SARS-CoV-2 RNA  is generally detectable in upper respiratory specimens during the acute phase of infection. The lowest concentration of SARS-CoV-2 viral copies this assay can detect is 138 copies/mL. A negative result does not preclude SARS-Cov-2 infection and should not be used as the sole basis for treatment or other patient management decisions. A  negative result may occur with  improper specimen collection/handling, submission of specimen other than nasopharyngeal swab, presence of viral mutation(s) within the areas targeted by this assay, and inadequate number of viral copies(<138 copies/mL). A negative result must be combined with clinical observations, patient history, and epidemiological information. The expected result is Negative.  Fact Sheet for Patients:  BloggerCourse.com  Fact Sheet for Healthcare Providers:  SeriousBroker.it  This test is no t yet approved or cleared by the [place] FDA and  has been authorized for detection and/or diagnosis of SARS-CoV-2 by FDA under an Emergency Use Authorization (EUA). This EUA will remain  in effect (meaning this test can be used) for the duration of the COVID-19 declaration under Section 564(b)(1) of the Act, 21 U.S.C.section 360bbb-3(b)(1), unless the authorization is terminated  or revoked sooner.       Influenza A by PCR NEGATIVE NEGATIVE   Influenza B by PCR NEGATIVE NEGATIVE    Comment: (NOTE) The Xpert Xpress SARS-CoV-2/FLU/RSV plus assay is intended as an aid in the diagnosis of influenza from Nasopharyngeal swab specimens and should not be used as a sole basis for treatment. Nasal washings and aspirates are unacceptable for Xpert Xpress SARS-CoV-2/FLU/RSV testing.  Fact Sheet for Patients: BloggerCourse.com  Fact Sheet for Healthcare Providers: SeriousBroker.it  This test is not yet approved or cleared by the [place] FDA and has been authorized for detection and/or diagnosis of SARS-CoV-2 by FDA under an Emergency Use Authorization (EUA). This EUA will remain in effect (meaning this test can be used) for the duration of the COVID-19 declaration under Section 564(b)(1) of the Act, 21 U.S.C. section 360bbb-3(b)(1), unless the authorization is terminated  or revoked.     Resp Syncytial Virus by PCR NEGATIVE NEGATIVE    Comment: (NOTE) Fact Sheet for Patients: BloggerCourse.com  Fact Sheet for Healthcare Providers: SeriousBroker.it  This test is not yet approved or cleared by the [place] FDA and has been authorized for detection and/or diagnosis of SARS-CoV-2 by FDA under an Emergency Use Authorization (EUA). This EUA will remain in effect (meaning this test can be used) for the duration of the COVID-19 declaration under Section 564(b)(1) of the Act, 21 U.S.C. section 360bbb-3(b)(1), unless the authorization is terminated or revoked.  Performed at [facility], [address], [place], [place] [address]   Comprehensive metabolic panel     Status: Abnormal   Collection Time: 04/23/23 10:13 PM  Result Value Ref Range   Sodium 132 (L) 135 - 145 mmol/L   Potassium 3.4 (L) 3.5 - 5.1 mmol/L   Chloride 100 98 - 111 mmol/L   CO2 25 22 - 32 mmol/L   Glucose, Bld 118 (H) 70 - 99 mg/dL    Comment: Glucose reference range applies only to samples taken after fasting for at least 8 hours.   BUN 22 (H) 6 - 20 mg/dL   Creatinine, Ser 6.04 0.61 - 1.24 mg/dL   Calcium 8.2 (L) 8.9 - 10.3 mg/dL   Total Protein 6.5 6.5 - 8.1 g/dL   Albumin 3.6 3.5 - 5.0 g/dL   AST 56 (H) 15 - 41 U/L   ALT 50 (H) 0 - 44 U/L  Alkaline Phosphatase 96 38 - 126 U/L   Total Bilirubin 0.6 0.3 - 1.2 mg/dL   GFR, Estimated >19 >14 mL/min    Comment: (NOTE) Calculated using the CKD-EPI Creatinine Equation (2021)    Anion gap 7 5 - 15    Comment: Performed at [facility], [address], [place], [place] [address]  CBC with Differential     Status: Abnormal   Collection Time: 04/23/23 10:13 PM  Result Value Ref Range   WBC 11.7 (H) 4.0 - 10.5 K/uL   RBC 3.74 (L) 4.22 - 5.81 MIL/uL   Hemoglobin 10.7 (L) 13.0 - 17.0 g/dL   HCT 62.1 (L) 30.8 - 65.7 %   MCV 85.0 80.0 - 100.0 fL   MCH 28.6 26.0 - 34.0 pg   MCHC  33.6 30.0 - 36.0 g/dL   RDW 84.6 96.2 - 95.2 %   Platelets 175 150 - 400 K/uL   nRBC 0.0 0.0 - 0.2 %   Neutrophils Relative % 89 %   Neutro Abs 10.3 (H) 1.7 - 7.7 K/uL   Lymphocytes Relative 3 %   Lymphs Abs 0.4 (L) 0.7 - 4.0 K/uL   Monocytes Relative 6 %   Monocytes Absolute 0.7 0.1 - 1.0 K/uL   Eosinophils Relative 2 %   Eosinophils Absolute 0.2 0.0 - 0.5 K/uL   Basophils Relative 0 %   Basophils Absolute 0.0 0.0 - 0.1 K/uL   Immature Granulocytes 0 %   Abs Immature Granulocytes 0.05 0.00 - 0.07 K/uL    Comment: Performed at [facility], [address], [place], [place] [address]  Culture, blood (routine x 2)     Status: None (Preliminary result)   Collection Time: 04/23/23 10:30 PM   Specimen: Right Antecubital; Blood  Result Value Ref Range   Specimen Description RIGHT ANTECUBITAL    Special Requests      NONE BOTTLES DRAWN AEROBIC AND ANAEROBIC Blood Culture results may not be optimal due to an excessive volume of blood received in culture bottles Performed at [facility], [address], [place], [place] [address]    Culture PENDING    Report Status PENDING   Lactic acid, plasma     Status: None   Collection Time: 04/23/23 10:30 PM  Result Value Ref Range   Lactic Acid, Venous 0.8 0.5 - 1.9 mmol/L    Comment: Performed at [facility], [address] [place], [place] [address]  Culture, blood (routine x 2)     Status: None (Preliminary result)   Collection Time: 04/23/23 11:29 PM   Specimen: BLOOD LEFT ARM  Result Value Ref Range   Specimen Description BLOOD LEFT ARM    Special Requests      BOTTLES DRAWN AEROBIC ONLY Blood Culture results may not be optimal due to an excessive volume of blood received in culture bottles Performed at [facility], [address], [place], [place] [address]    Culture PENDING    Report Status PENDING   Lactic acid, plasma     Status: None   Collection Time: 04/23/23 11:29 PM  Result Value Ref Range   Lactic Acid, Venous 0.8 0.5 -  1.9 mmol/L    Comment: Performed at [facility], [address], [place], [place] [address]  Protime-INR     Status: Abnormal   Collection Time: 04/23/23 11:29 PM  Result Value Ref Range   Prothrombin Time 16.4 (H) 11.4 - 15.2 seconds   INR 1.3 (H) 0.8 - 1.2  Comment: (NOTE) INR goal varies based on device and disease states. Performed at [facility], [address], [place], [place] [address]   APTT     Status: Abnormal   Collection Time: 04/23/23 11:29 PM  Result Value Ref Range   aPTT 41 (H) 24 - 36 seconds    Comment:        IF BASELINE aPTT IS ELEVATED, SUGGEST PATIENT RISK ASSESSMENT BE USED TO DETERMINE APPROPRIATE ANTICOAGULANT THERAPY. Performed at [facility], [address], [place], [place] [address]   Urinalysis, w/ Reflex to Culture (Infection Suspected) -Urine, Clean Catch     Status: Abnormal   Collection Time: 04/24/23  1:25 AM  Result Value Ref Range   Specimen Source URINE, CLEAN CATCH    Color, Urine STRAW (A) YELLOW   APPearance CLEAR CLEAR   Specific Gravity, Urine 1.013 1.005 - 1.030   pH 6.0 5.0 - 8.0   Glucose, UA NEGATIVE NEGATIVE mg/dL   Hgb urine dipstick NEGATIVE NEGATIVE   Bilirubin Urine NEGATIVE NEGATIVE   Ketones, ur NEGATIVE NEGATIVE mg/dL   Protein, ur NEGATIVE NEGATIVE mg/dL   Nitrite NEGATIVE NEGATIVE   Leukocytes,Ua NEGATIVE NEGATIVE   RBC / HPF 0-5 0 - 5 RBC/hpf   WBC, UA 0-5 0 - 5 WBC/hpf    Comment:        Reflex urine culture not performed if WBC <=10, OR if Squamous epithelial cells >5. If Squamous epithelial cells >5 suggest recollection.    Bacteria, UA NONE SEEN NONE SEEN   Squamous Epithelial / HPF 0-5 0 - 5 /HPF    Comment: Performed at [facility] [address], [place], [place] [address]   CT CHEST ABDOMEN PELVIS W CONTRAST  Result Date: 04/24/2023 CLINICAL DATA:  Fever and chills for several hours, initial encounter EXAM: CT CHEST, ABDOMEN, AND PELVIS WITH CONTRAST TECHNIQUE: Multidetector CT imaging of the  chest, abdomen and pelvis was performed following the standard protocol during bolus administration of intravenous contrast. RADIATION DOSE REDUCTION: This exam was performed according to the departmental dose-optimization program which includes automated exposure control, adjustment of the mA and/or kV according to patient size and/or use of iterative reconstruction technique. CONTRAST:  OMNIPAQUE IOHEXOL 300 MG/ML  SOLN COMPARISON:  Chest x-ray from earlier in the same day, CT from 11/13/2022. FINDINGS: CT CHEST FINDINGS Cardiovascular: Thoracic aorta shows a normal branching pattern. Coronary calcifications are noted. No cardiac enlargement is seen. Pulmonary artery as visualized is within normal limits although not timed for embolus evaluation. Mediastinum/Nodes: Thoracic inlet is within normal limits. No hilar or mediastinal adenopathy is noted. The esophagus as visualized is within normal limits. Lungs/Pleura: Lungs are well aerated bilaterally. No focal infiltrate or sizable effusion is seen. Musculoskeletal: Visualized ribcage shows multiple healed fractures bilaterally. Degenerative changes of the thoracic spine are noted. Prior surgical repair of multiple left rib fractures are seen. CT ABDOMEN PELVIS FINDINGS Hepatobiliary: Geographic fatty infiltration of the liver is noted. No gallstones, gallbladder wall thickening, or biliary dilatation. Pancreas: Unremarkable. No pancreatic ductal dilatation or surrounding inflammatory changes. Spleen: Splenic granulomas are noted. Adrenals/Urinary Tract: Adrenal glands are within normal limits. Kidneys demonstrate a normal enhancement pattern. No renal calculi or obstructive changes are seen. Bladder is well distended. Stomach/Bowel: Scattered diverticular change of the colon is noted. Fecal material is noted throughout the colon without obstructive change. The appendix is within normal limits. Small bowel and stomach are unremarkable. Vascular/Lymphatic:  Aortic atherosclerosis. No enlarged abdominal or pelvic lymph nodes. Reproductive: Prostate  is unremarkable. Other: No abdominal wall hernia or abnormality. No abdominopelvic ascites. Musculoskeletal: Postsurgical changes in the proximal left femur are seen. Degenerative changes of lumbar spine are noted. IMPRESSION: CT of the chest: Multiple old rib fractures bilaterally with healing. CT of the abdomen and pelvis: Diverticulosis without diverticulitis. No acute abnormality noted. Electronically Signed   By: Alcide Clever M.D.   On: 04/24/2023 00:06   DG Chest Port 1 View  Result Date: 04/23/2023 CLINICAL DATA:  Chills, fever, pain and dizziness. EXAM: PORTABLE CHEST 1 VIEW COMPARISON:  November 13, 2022 FINDINGS: The heart size and mediastinal contours are within normal limits. Low lung volumes are noted with mild, diffuse, chronic appearing increased lung markings. There is no evidence of acute infiltrate, pleural effusion or pneumothorax. Radiopaque fixation plates and screws are seen overlying multiple left ribs. A chronic deformity of the right scapula is noted. IMPRESSION: Low lung volumes without evidence of acute or active cardiopulmonary disease. Electronically Signed   By: Aram Candela M.D.   On: 04/23/2023 23:17    Pending Labs Unresulted Labs (From admission, onward)     Start     Ordered   04/24/23 0138  Procalcitonin  Add-on,   AD       References:    Procalcitonin Lower Respiratory Tract Infection AND Sepsis Procalcitonin Algorithm   04/24/23 0137            Vitals/Pain Today's Vitals   04/24/23 0030 04/24/23 0100 04/24/23 0130 04/24/23 0131  BP: (!) 96/54 (!) 79/47 (!) 96/45   Pulse:  71 85 76  Resp:  13 (!) 23 16  Temp:      TempSrc:      SpO2:  100% 100% 100%  Weight:      Height:      PainSc:        Isolation Precautions No active isolations  Medications Medications  lactated ringers infusion ( Intravenous New Bag/Given 04/23/23 2336)  ceFEPIme (MAXIPIME)  2 g in sodium chloride 0.9 % 100 mL IVPB (has no administration in time range)  vancomycin (VANCOREADY) IVPB 750 mg/150 mL (has no administration in time range)  acetaminophen (TYLENOL) tablet 650 mg (650 mg Oral Given 04/23/23 2019)  sodium chloride 0.9 % bolus 1,000 mL (0 mLs Intravenous Stopped 04/23/23 2335)  ceFEPIme (MAXIPIME) 2 g in sodium chloride 0.9 % 100 mL IVPB (0 g Intravenous Stopped 04/23/23 2331)  vancomycin (VANCOCIN) IVPB 1000 mg/200 mL premix (0 mg Intravenous Stopped 04/24/23 0008)  sodium chloride 0.9 % bolus 1,000 mL (0 mLs Intravenous Stopped 04/23/23 2351)  iohexol (OMNIPAQUE) 300 MG/ML solution 100 mL (100 mLs Intravenous Contrast Given 04/23/23 2353)  sodium chloride 0.9 % bolus 1,000 mL (0 mLs Intravenous Stopped 04/24/23 0103)    Mobility walks     Focused Assessments    R Recommendations: See Admitting Provider Note  Report given to:   Additional Notes:

## 2023-04-24 NOTE — Progress Notes (Signed)
Patient had cbg of 63.  Hypoglycemic protocol initiated by primary nurse.

## 2023-04-24 NOTE — Assessment & Plan Note (Addendum)
-   Repleted and within normal limits at discharge -Recommending repeat basic metabolic panel to assess electrolytes trend/stability at follow-up visit.

## 2023-04-24 NOTE — Progress Notes (Signed)
Patient seen and examined; admitted after midnight secondary to fever, generalized weakness and general malaise.  Patient met criteria for SIRS but not source identified for infection at the moment.  After fluid resuscitation patient's heart rate and blood pressure demonstrated improvement with MAP above 68 at time of evaluation.  Please refer to H&P written by Dr. Carren Rang for further info/details on admission.  Plan: -Follow culture results -Continue current broad-spectrum antibiotics, IV fluids and supportive care -Continue as needed antipyretics -Follow clinical response.  Vassie Loll MD [phone]

## 2023-04-24 NOTE — Assessment & Plan Note (Addendum)
-   Febrile at 101, tachycardic at 116, tachypneic at 34, hypotension at 79/38 -Negative COVID and flu - Blood culture without any growth - Chest x-ray shows low lung volumes without acute frank infiltrates and demonstrating bronchitic changes. - UA is not indicative of UTI - Excellent response to fluid resuscitation, supportive care and antibiotic tailor for bronchitis. -Procalcitonin 1.71 suggesting no systemic bacterial infection. -Patient has been discharged home on oral doxycycline to complete therapy and advised to keep himself well-hydrated and follow-up with PCP in 10 days.

## 2023-04-24 NOTE — Assessment & Plan Note (Signed)
-   Glucose well-controlled on Camitta 118 - Holding metformin - Sliding scale coverage - Continue to monitor

## 2023-04-25 DIAGNOSIS — E1169 Type 2 diabetes mellitus with other specified complication: Secondary | ICD-10-CM | POA: Diagnosis not present

## 2023-04-25 DIAGNOSIS — R1310 Dysphagia, unspecified: Secondary | ICD-10-CM

## 2023-04-25 DIAGNOSIS — J40 Bronchitis, not specified as acute or chronic: Secondary | ICD-10-CM | POA: Diagnosis not present

## 2023-04-25 DIAGNOSIS — R6510 Systemic inflammatory response syndrome (SIRS) of non-infectious origin without acute organ dysfunction: Secondary | ICD-10-CM | POA: Diagnosis not present

## 2023-04-25 DIAGNOSIS — E876 Hypokalemia: Secondary | ICD-10-CM | POA: Diagnosis not present

## 2023-04-25 LAB — MAGNESIUM: Magnesium: 1.7 mg/dL (ref 1.7–2.4)

## 2023-04-25 LAB — CBC
HCT: 31 % — ABNORMAL LOW (ref 39.0–52.0)
Hemoglobin: 10.1 g/dL — ABNORMAL LOW (ref 13.0–17.0)
MCH: 28.9 pg (ref 26.0–34.0)
MCHC: 32.6 g/dL (ref 30.0–36.0)
MCV: 88.8 fL (ref 80.0–100.0)
Platelets: 163 10*3/uL (ref 150–400)
RBC: 3.49 MIL/uL — ABNORMAL LOW (ref 4.22–5.81)
RDW: 13.8 % (ref 11.5–15.5)
WBC: 7.2 10*3/uL (ref 4.0–10.5)
nRBC: 0 % (ref 0.0–0.2)

## 2023-04-25 LAB — BASIC METABOLIC PANEL
Anion gap: 4 — ABNORMAL LOW (ref 5–15)
BUN: 14 mg/dL (ref 6–20)
CO2: 24 mmol/L (ref 22–32)
Calcium: 7.9 mg/dL — ABNORMAL LOW (ref 8.9–10.3)
Chloride: 108 mmol/L (ref 98–111)
Creatinine, Ser: 0.71 mg/dL (ref 0.61–1.24)
GFR, Estimated: >60 mL/min (ref >60)
Glucose, Bld: 88 mg/dL (ref 70–99)
Potassium: 3.7 mmol/L (ref 3.5–5.1)
Sodium: 136 mmol/L (ref 135–145)

## 2023-04-25 LAB — GLUCOSE, CAPILLARY
Glucose-Capillary: 87 mg/dL (ref 70–99)
Glucose-Capillary: 105 mg/dL — ABNORMAL HIGH (ref 70–99)

## 2023-04-25 MED ORDER — FERROUS SULFATE 325 (65 FE) MG PO TABS: 325.0000 mg | ORAL_TABLET | Freq: Every day | ORAL | 2 refills | Status: AC

## 2023-04-25 MED ORDER — ENSURE ENLIVE PO LIQD: 237.0000 mL | Freq: Two times a day (BID) | ORAL | Status: DC

## 2023-04-25 MED ORDER — DOXYCYCLINE HYCLATE 100 MG PO TABS
100.0000 mg | ORAL_TABLET | Freq: Two times a day (BID) | ORAL | Status: DC
  Administered 2023-04-25: 100 mg via ORAL
  Filled 2023-04-25: qty 1

## 2023-04-25 MED ORDER — DOXYCYCLINE HYCLATE 100 MG PO TABS: 100.0000 mg | ORAL_TABLET | Freq: Two times a day (BID) | ORAL | 0 refills | Status: AC

## 2023-04-25 NOTE — Assessment & Plan Note (Addendum)
-  No wheezing and no oxygen requirement appreciated at discharge -Excellent response to the use of doxycycline empirically. -Patient advised to complete medications are prescribed and to use of as needed bronchodilators and the use of mucolytic's.

## 2023-04-25 NOTE — Progress Notes (Signed)
Nsg Discharge Note  Admit Date:  04/23/2023 Discharge date: 04/25/2023   Eric Vega to be D/C'd Home per MD order.  AVS completed.   Patient/caregiver able to verbalize understanding.  Discharge Medication: Allergies as of 04/25/2023   No Known Allergies      Medication List     STOP taking these medications    amoxicillin 500 MG capsule Commonly known as: AMOXIL   losartan 50 MG tablet Commonly known as: COZAAR       TAKE these medications    albuterol 108 (90 Base) MCG/ACT inhaler Commonly known as: VENTOLIN HFA Inhale 2 puffs into the lungs every 6 (six) hours as needed for wheezing or shortness of breath.   doxycycline 100 MG tablet Commonly known as: VIBRA-TABS Take 1 tablet (100 mg total) by mouth every 12 (twelve) hours for 5 days.   feeding supplement Liqd Take 237 mLs by mouth 2 (two) times daily between meals.   ferrous sulfate 325 (65 FE) MG tablet Take 1 tablet (325 mg total) by mouth daily with breakfast.   metFORMIN 500 MG tablet Commonly known as: GLUCOPHAGE Take 1 tablet (500 mg total) by mouth daily with breakfast.   multivitamin tablet Take 1 tablet by mouth daily.        Discharge Assessment: Vitals:   04/25/23 0333 04/25/23 1213  BP: (!) 98/56 116/67  Pulse: 64 74  Resp:  18  Temp: 97.8 F (36.6 C) (!) 97.5 F (36.4 C)  SpO2: 100% 97%   Skin clean, dry and intact without evidence of skin break down, no evidence of skin tears noted. IV catheter discontinued intact. Site without signs and symptoms of complications - no redness or edema noted at insertion site, patient denies c/o pain - only slight tenderness at site.  Dressing with slight pressure applied.  D/c Instructions-Education: Discharge instructions given to patient/family with verbalized understanding. D/c education completed with patient/family including follow up instructions, medication list, d/c activities limitations if indicated, with other d/c instructions as  indicated by MD - patient able to verbalize understanding, all questions fully answered. Patient instructed to return to ED, call 911, or call MD for any changes in condition.  Patient escorted via WC, and D/C home via private auto.  Laurena Spies, RN 04/25/2023 2:00 PM

## 2023-04-25 NOTE — Progress Notes (Signed)
Patient cbg recheck 110.

## 2023-04-25 NOTE — Assessment & Plan Note (Signed)
-  Prior history of dysphagia to solid after tracheostomy and prolonged intubation from an accident in the past. -Dysphagia 3 diet recommended along with instructions to safely swallow food and minimize aspiration. -Continue outpatient follow-up with PCP. -Patient advised to maintain adequate hydration.

## 2023-04-25 NOTE — Discharge Summary (Signed)
Physician Discharge Summary   Patient: Eric Vega MRN: [id number] DOB: [birth year]  Admit date:     04/23/2023  Discharge date: 04/25/23  Discharge Physician: Eric Vega   PCP: Eric Dubin, Vega   Recommendations at discharge:  Repeat basic metabolic panel to follow electrolytes and renal function Reassessed patient's CBGs/A1c with further adjustment to hypoglycemia regimen as needed.  Discharge Diagnoses: Principal Problem:   SIRS (systemic inflammatory response syndrome) (HCC) Active Problems:   Type 2 diabetes mellitus with hyperglycemia (HCC)   Dysphagia   Hypokalemia   Bronchitis  Brief Hospital admission narrative: As per H&P written by Dr. Carren Rang on 04/25/23 Eric Vega is a 62 y.o. 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  Patient is a very poor historian.  He reports that he came in because he just has the flu.  He denies cough, headache, diarrhea, abdominal pain, dysuria, rashes, sores.  He then again says he just wants to let me know the only thing he had was the flu.  He does report that he had a fever yesterday.  It was sudden onset.  He reports myalgias associated with a fever.  He did not feel short of breath.  They do have him on oxygen at the time of my exam on the floor.  There are no documented hypoxic O2 sats.  Patient reports that he did have nausea and he had 1 episode of vomiting.  It was nonbloody emesis.  He denies any other symptoms at this time.  An interpreter was used for this encounter as patient is primarily Spanish-speaking.   Assessment and Plan: * SIRS (systemic inflammatory response syndrome) (HCC) - Febrile at 101, tachycardic at 116, tachypneic at 34, hypotension at 79/38 -Negative COVID and flu - Blood culture without any growth - Chest x-ray shows low lung  volumes without acute frank infiltrates and demonstrating bronchitic changes. - UA is not indicative of UTI - Excellent response to fluid resuscitation, supportive care and antibiotic tailor for bronchitis. -Procalcitonin 1.71 suggesting no systemic bacterial infection. -Patient has been discharged home on oral doxycycline to complete therapy and advised to keep himself well-hydrated and follow-up with PCP in 10 days.    Bronchitis -No wheezing and no oxygen requirement appreciated at discharge -Excellent response to the use of doxycycline empirically. -Patient advised to complete medications are prescribed and to use of as needed bronchodilators and the use of mucolytic's.  Hypokalemia - Repleted and within normal limits at discharge -Recommending repeat basic metabolic panel to assess electrolytes trend/stability at follow-up visit.  Dysphagia -Prior history of dysphagia to solid after tracheostomy and prolonged intubation from an accident in the past. -Dysphagia 3 diet recommended along with instructions to safely swallow food and minimize aspiration. -Continue outpatient follow-up with PCP. -Patient advised to maintain adequate hydration.  Type 2 diabetes mellitus with hyperglycemia (HCC) - A1c 7.0 -Continue modified carbohydrate diet and the use of metformin as previously prescribed -Continue outpatient follow-up with PCP to further adjust hypoglycemic regimen as required.   Consultants: None Procedures performed: See below for x-ray reports. Disposition: Home Diet recommendation: Modified carbohydrate diet.  DISCHARGE MEDICATION: Allergies as of 04/25/2023   No Known Allergies      Medication List     STOP taking these medications    amoxicillin 500 MG capsule Commonly known as: AMOXIL   losartan 50  MG tablet Commonly known as: COZAAR       TAKE these medications    albuterol 108 (90 Base) MCG/ACT inhaler Commonly known as: VENTOLIN HFA Inhale 2 puffs  into the lungs every 6 (six) hours as needed for wheezing or shortness of breath.   doxycycline 100 MG tablet Commonly known as: VIBRA-TABS Take 1 tablet (100 mg total) by mouth every 12 (twelve) hours for 5 days.   feeding supplement Liqd Take 237 mLs by mouth 2 (two) times daily between meals.   ferrous sulfate 325 (65 FE) MG tablet Take 1 tablet (325 mg total) by mouth daily with breakfast.   metFORMIN 500 MG tablet Commonly known as: GLUCOPHAGE Take 1 tablet (500 mg total) by mouth daily with breakfast.   multivitamin tablet Take 1 tablet by mouth daily.        Follow-up Information     [name], Eric Reil, Vega. Schedule an appointment as soon as possible for a visit in 10 day(s).   Specialty: Family Medicine Contact information: [address] [place] [address] [phone]                Discharge Exam: Eric Vega Weights   04/23/23 1951  Weight: 61.7 kg   General exam: Alert, awake, oriented x 3; chronically ill in appearance; expressing feeling ready to go home and denying any acute complaints. Respiratory system: No wheezing, no crackles, positive scattered rhonchi.  No using accessory muscle.  Good saturation on room air. Cardiovascular system:RRR. No murmurs, rubs, gallops.  No JVD. Gastrointestinal system: Abdomen is nondistended, soft and nontender. No organomegaly or masses felt. Normal bowel sounds heard. Central nervous system: Alert and oriented. No focal neurological deficits. Extremities: No C/C/E, +pedal pulses Skin: No rashes, lesions or ulcers Psychiatry: Judgement and insight appear normal. Mood & affect appropriate.    Condition at discharge: Stable and improved.  The results of significant diagnostics from this hospitalization (including imaging, microbiology, ancillary and laboratory) are listed below for reference.   Imaging Studies: CT CHEST ABDOMEN PELVIS W CONTRAST  Result Date: 04/24/2023 CLINICAL DATA:  Fever and chills for several  hours, initial encounter EXAM: CT CHEST, ABDOMEN, AND PELVIS WITH CONTRAST TECHNIQUE: Multidetector CT imaging of the chest, abdomen and pelvis was performed following the standard protocol during bolus administration of intravenous contrast. RADIATION DOSE REDUCTION: This exam was performed according to the departmental dose-optimization program which includes automated exposure control, adjustment of the mA and/or kV according to patient size and/or use of iterative reconstruction technique. CONTRAST:  OMNIPAQUE IOHEXOL 300 MG/ML  SOLN COMPARISON:  Chest x-ray from earlier in the same day, CT from 11/13/2022. FINDINGS: CT CHEST FINDINGS Cardiovascular: Thoracic aorta shows a normal branching pattern. Coronary calcifications are noted. No cardiac enlargement is seen. Pulmonary artery as visualized is within normal limits although not timed for embolus evaluation. Mediastinum/Nodes: Thoracic inlet is within normal limits. No hilar or mediastinal adenopathy is noted. The esophagus as visualized is within normal limits. Lungs/Pleura: Lungs are well aerated bilaterally. No focal infiltrate or sizable effusion is seen. Musculoskeletal: Visualized ribcage shows multiple healed fractures bilaterally. Degenerative changes of the thoracic spine are noted. Prior surgical repair of multiple left rib fractures are seen. CT ABDOMEN PELVIS FINDINGS Hepatobiliary: Geographic fatty infiltration of the liver is noted. No gallstones, gallbladder wall thickening, or biliary dilatation. Pancreas: Unremarkable. No pancreatic ductal dilatation or surrounding inflammatory changes. Spleen: Splenic granulomas are noted. Adrenals/Urinary Tract: Adrenal glands are within normal limits. Kidneys demonstrate a normal  enhancement pattern. No renal calculi or obstructive changes are seen. Bladder is well distended. Stomach/Bowel: Scattered diverticular change of the colon is noted. Fecal material is noted throughout the colon without  obstructive change. The appendix is within normal limits. Small bowel and stomach are unremarkable. Vascular/Lymphatic: Aortic atherosclerosis. No enlarged abdominal or pelvic lymph nodes. Reproductive: Prostate is unremarkable. Other: No abdominal wall hernia or abnormality. No abdominopelvic ascites. Musculoskeletal: Postsurgical changes in the proximal left femur are seen. Degenerative changes of lumbar spine are noted. IMPRESSION: CT of the chest: Multiple old rib fractures bilaterally with healing. CT of the abdomen and pelvis: Diverticulosis without diverticulitis. No acute abnormality noted. Electronically Signed   By: Alcide Clever M.D.   On: 04/24/2023 00:06   DG Chest Port 1 View  Result Date: 04/23/2023 CLINICAL DATA:  Chills, fever, pain and dizziness. EXAM: PORTABLE CHEST 1 VIEW COMPARISON:  November 13, 2022 FINDINGS: The heart size and mediastinal contours are within normal limits. Low lung volumes are noted with mild, diffuse, chronic appearing increased lung markings. There is no evidence of acute infiltrate, pleural effusion or pneumothorax. Radiopaque fixation plates and screws are seen overlying multiple left ribs. A chronic deformity of the right scapula is noted. IMPRESSION: Low lung volumes without evidence of acute or active cardiopulmonary disease. Electronically Signed   By: Aram Candela M.D.   On: 04/23/2023 23:17    Microbiology: Results for orders placed or performed during the hospital encounter of 04/23/23  Resp panel by RT-PCR (RSV, Flu A&B, Covid) Anterior Nasal Swab     Status: None   Collection Time: 04/23/23  7:55 PM   Specimen: Anterior Nasal Swab  Result Value Ref Range Status   SARS Coronavirus 2 by RT PCR NEGATIVE NEGATIVE Final    Comment: (NOTE) SARS-CoV-2 target nucleic acids are NOT DETECTED.  The SARS-CoV-2 RNA is generally detectable in upper respiratory specimens during the acute phase of infection. The lowest concentration of SARS-CoV-2 viral  copies this assay can detect is 138 copies/mL. A negative result does not preclude SARS-Cov-2 infection and should not be used as the sole basis for treatment or other patient management decisions. A negative result may occur with  improper specimen collection/handling, submission of specimen other than nasopharyngeal swab, presence of viral mutation(s) within the areas targeted by this assay, and inadequate number of viral copies(<138 copies/mL). A negative result must be combined with clinical observations, patient history, and epidemiological information. The expected result is Negative.  Fact Sheet for Patients:  BloggerCourse.com  Fact Sheet for Healthcare Providers:  SeriousBroker.it  This test is no t yet approved or cleared by the [place] FDA and  has been authorized for detection and/or diagnosis of SARS-CoV-2 by FDA under an Emergency Use Authorization (EUA). This EUA will remain  in effect (meaning this test can be used) for the duration of the COVID-19 declaration under Section 564(b)(1) of the Act, 21 U.S.C.section 360bbb-3(b)(1), unless the authorization is terminated  or revoked sooner.       Influenza A by PCR NEGATIVE NEGATIVE Final   Influenza B by PCR NEGATIVE NEGATIVE Final    Comment: (NOTE) The Xpert Xpress SARS-CoV-2/FLU/RSV plus assay is intended as an aid in the diagnosis of influenza from Nasopharyngeal swab specimens and should not be used as a sole basis for treatment. Nasal washings and aspirates are unacceptable for Xpert Xpress SARS-CoV-2/FLU/RSV testing.  Fact Sheet for Patients: BloggerCourse.com  Fact Sheet for Healthcare Providers: SeriousBroker.it  This test is not yet approved or  cleared by the [place] and has been authorized for detection and/or diagnosis of SARS-CoV-2 by FDA under an Emergency Use Authorization (EUA). This  EUA will remain in effect (meaning this test can be used) for the duration of the COVID-19 declaration under Section 564(b)(1) of the Act, 21 U.S.C. section 360bbb-3(b)(1), unless the authorization is terminated or revoked.     Resp Syncytial Virus by PCR NEGATIVE NEGATIVE Final    Comment: (NOTE) Fact Sheet for Patients: BloggerCourse.com  Fact Sheet for Healthcare Providers: SeriousBroker.it  This test is not yet approved or cleared by the [place] FDA and has been authorized for detection and/or diagnosis of SARS-CoV-2 by FDA under an Emergency Use Authorization (EUA). This EUA will remain in effect (meaning this test can be used) for the duration of the COVID-19 declaration under Section 564(b)(1) of the Act, 21 U.S.C. section 360bbb-3(b)(1), unless the authorization is terminated or revoked.  Performed at [facility], [address], [place], [place] [address]   Culture, blood (routine x 2)     Status: None (Preliminary result)   Collection Time: 04/23/23 10:30 PM   Specimen: Right Antecubital; Blood  Result Value Ref Range Status   Specimen Description   Final    RIGHT ANTECUBITAL Performed at [facility], [address], [place], [place] [address]    Special Requests   Final    NONE BOTTLES DRAWN AEROBIC AND ANAEROBIC Blood Culture results may not be optimal due to an excessive volume of blood received in culture bottles Performed at [facility], [address] [place], [place] [address]    Culture   Final    NO GROWTH < 24 HOURS Performed at [facility], [address], [place], [place] [address]    Report Status PENDING  Incomplete  Culture, blood (routine x 2)     Status: None (Preliminary result)   Collection Time: 04/23/23 11:29 PM   Specimen: BLOOD LEFT ARM  Result Value Ref Range Status   Specimen Description   Final    BLOOD LEFT ARM Performed at [facility], [address], [place],  [place] [address]    Special Requests   Final    BOTTLES DRAWN AEROBIC ONLY Blood Culture results may not be optimal due to an excessive volume of blood received in culture bottles Performed at [facility], [address], [place], [place] [address]    Culture   Final    NO GROWTH < 24 HOURS Performed at [facility], [address], [place], [place] [address]    Report Status PENDING  Incomplete    Labs: CBC: Recent Labs  Lab 04/23/23 2213 04/24/23 0449 04/25/23 0425  WBC 11.7* 12.9* 7.2  NEUTROABS 10.3* 10.9*  --   HGB 10.7* 10.8* 10.1*  HCT 31.8* 32.5* 31.0*  MCV 85.0 87.8 88.8  PLT 175 173 163   Basic Metabolic Panel: Recent Labs  Lab 04/23/23 2213 04/24/23 0449 04/25/23 0425  NA 132* 135 136  K 3.4* 4.1 3.7  CL 100 109 108  CO2 25 22 24   GLUCOSE 118* 114* 88  BUN 22* 17 14  CREATININE 0.83 0.62 0.71  CALCIUM 8.2* 7.5* 7.9*  MG  --  1.7 1.7   Liver Function Tests: Recent Labs  Lab 04/23/23 2213 04/24/23 0449  AST 56* 32  ALT 50* 40  ALKPHOS 96 75  BILITOT 0.6 0.6  PROT 6.5 5.4*  ALBUMIN 3.6 2.9*   CBG: Recent Labs  Lab  04/24/23 1619 04/24/23 2016 04/24/23 2107 04/25/23 0724 04/25/23 1141  GLUCAP 164* 63* 110* 87 105*    Discharge time spent: greater than 30 minutes.  Signed: Vassie Loll, MD [facility] 04/25/2023

## 2023-04-28 LAB — CULTURE, BLOOD (ROUTINE X 2)
Culture: NO GROWTH
Culture: NO GROWTH

## 2023-05-06 ENCOUNTER — Ambulatory Visit: Payer: Medicaid Other | Admitting: General Surgery

## 2023-05-31 ENCOUNTER — Encounter (INDEPENDENT_AMBULATORY_CARE_PROVIDER_SITE_OTHER): Payer: Self-pay | Admitting: Gastroenterology

## 2023-05-31 ENCOUNTER — Ambulatory Visit (INDEPENDENT_AMBULATORY_CARE_PROVIDER_SITE_OTHER): Payer: Medicaid Other | Admitting: Gastroenterology

## 2023-06-01 ENCOUNTER — Encounter: Payer: Self-pay | Admitting: General Surgery

## 2023-06-01 ENCOUNTER — Ambulatory Visit (INDEPENDENT_AMBULATORY_CARE_PROVIDER_SITE_OTHER): Payer: Medicaid Other | Admitting: General Surgery

## 2023-06-01 VITALS — BP 134/74 | HR 73 | Temp 98.0°F | Resp 12 | Ht 65.0 in | Wt 136.0 lb

## 2023-06-01 DIAGNOSIS — Z1211 Encounter for screening for malignant neoplasm of colon: Secondary | ICD-10-CM

## 2023-06-01 MED ORDER — SUTAB 1479-225-188 MG PO TABS: 24.0000 | ORAL_TABLET | Freq: Once | ORAL | 0 refills | Status: AC

## 2023-06-01 NOTE — Progress Notes (Signed)
Eric Vega; [id number]; October 08, 1961   HPI Patient is a 62 year old Hispanic male who was referred to my care by [facility] for evaluation and treatment of a sebaceous cyst on his back as well as for a screening colonoscopy.  Patient states he has had a sebaceous cyst on his back for many years.  It is not bothering him at the present time.  He denies any significant weight loss, blood per rectum, or family history of colon cancer.  He has never had a colonoscopy.  History was obtained through an interpreter. Past Medical History:  Diagnosis Date   Acute febrile illness 11/14/2022   Acute hypoxemic respiratory failure (HCC) 06/03/2022   Acute respiratory failure with hypoxia and hypercapnia (HCC) 06/03/2020   Closed fracture of multiple ribs of both sides 06/03/2020   Closed fracture of shaft of left femur with delayed healing 06/03/2020   Critical polytrauma 06/03/2020   Diabetes mellitus without complication (HCC)    Difficulty liberating from mechanical ventilation (HCC) 06/03/2020   Hemothorax with pneumothorax, traumatic 06/03/2020   Hypoalbuminemia due to protein-calorie malnutrition (HCC) 11/14/2022   Hypomagnesemia 06/04/2022   Hyponatremia 06/04/2022   Major depressive disorder with single episode, 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   Malnutrition of moderate degree 06/05/2022   Normocytic anemia 11/14/2022   Pericardial effusion 06/03/2020   Sepsis due to Enterococcus (HCC) 11/17/2022   Sepsis without acute organ dysfunction (HCC) 06/04/2022   SIRS (systemic inflammatory response syndrome) (HCC) 11/14/2022   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 No pertinent family history.  Current Outpatient Medications on File Prior to Visit  Medication Sig Dispense Refill   albuterol (VENTOLIN HFA) 108 (90 Base) MCG/ACT inhaler Inhale 2 puffs into the lungs every 6 (six) hours as needed for wheezing or shortness of breath.     feeding supplement (ENSURE ENLIVE / ENSURE PLUS) LIQD Take 237 mLs by mouth 2 (two) times daily between meals.     ferrous sulfate 325 (65 FE) MG tablet Take 1 tablet (325 mg total) by mouth daily with breakfast. 30 tablet 2   metFORMIN (GLUCOPHAGE) 500 MG tablet Take 1 tablet (500 mg total) by mouth daily with breakfast. 30 tablet 3   Multiple Vitamin (MULTIVITAMIN) tablet Take 1 tablet by mouth daily.     No current facility-administered medications on file prior to visit.    No Known Allergies  Social History   Substance and Sexual Activity  Alcohol Use Not Currently    Social History   Tobacco Use  Smoking Status Never  Smokeless Tobacco Never    Review of Systems  Constitutional: Negative.   HENT: Negative.    Eyes: Negative.   Respiratory: Negative.    Cardiovascular: Negative.   Gastrointestinal: Negative.   Genitourinary: Negative.   Musculoskeletal: Negative.   Skin: Negative.   Neurological: Negative.   Endo/Heme/Allergies: Negative.   Psychiatric/Behavioral: Negative.      Objective   Vitals:   06/01/23 1314  BP: 134/74  Pulse: 73  Resp: 12  Temp: 98 F (36.7 C)  SpO2: 98%    Physical Exam Vitals reviewed.  Constitutional:  Appearance: Normal appearance. He is normal weight. He is not ill-appearing.  HENT:     Head: Normocephalic and atraumatic.  Cardiovascular:     Rate and Rhythm: Normal rate and regular rhythm.     Heart sounds: Normal heart sounds. No murmur heard.    No friction rub. No gallop.  Pulmonary:     Effort: Pulmonary effort is normal.     Breath sounds: Normal breath sounds.  Abdominal:      General: Abdomen is flat. Bowel sounds are normal. There is no distension.     Palpations: Abdomen is soft. There is no mass.     Tenderness: There is no abdominal tenderness. There is no guarding or rebound.     Hernia: No hernia is present.  Skin:    General: Skin is warm and dry.     Comments: All sebaceous cyst noted in the left upper back.  Sebum was expressed.  No purulent drainage was noted.  Neurological:     Mental Status: He is alert and oriented to person, place, and time.   Primary care notes reviewed  Assessment  Need for screening colonoscopy Sebaceous cyst, back.  Not currently infected. Plan  Patient is scheduled for a screening colonoscopy on 06/15/2023.  The risks and benefits of the procedure including bleeding and perforation were fully explained to the patient, who gave informed consent.  Sutabs have been prescribed.

## 2023-06-03 NOTE — H&P (Signed)
Eric Vega; [id number]; 09/11/1961   HPI Patient is a 61-year-old Hispanic male who was referred to my care by [facility] for evaluation and treatment of a sebaceous cyst on his back as well as for a screening colonoscopy.  Patient states he has had a sebaceous cyst on his back for many years.  It is not bothering him at the present time.  He denies any significant weight loss, blood per rectum, or family history of colon cancer.  He has never had a colonoscopy.  History was obtained through an interpreter. Past Medical History:  Diagnosis Date   Acute febrile illness 11/14/2022   Acute hypoxemic respiratory failure (HCC) 06/03/2022   Acute respiratory failure with hypoxia and hypercapnia (HCC) 06/03/2020   Closed fracture of multiple ribs of both sides 06/03/2020   Closed fracture of shaft of left femur with delayed healing 06/03/2020   Critical polytrauma 06/03/2020   Diabetes mellitus without complication (HCC)    Difficulty liberating from mechanical ventilation (HCC) 06/03/2020   Hemothorax with pneumothorax, traumatic 06/03/2020   Hypoalbuminemia due to protein-calorie malnutrition (HCC) 11/14/2022   Hypomagnesemia 06/04/2022   Hyponatremia 06/04/2022   Major depressive disorder with single episode, 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   Malnutrition of moderate degree 06/05/2022   Normocytic anemia 11/14/2022   Pericardial effusion 06/03/2020   Sepsis due to Enterococcus (HCC) 11/17/2022   Sepsis without acute organ dysfunction (HCC) 06/04/2022   SIRS (systemic inflammatory response syndrome) (HCC) 11/14/2022   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 No pertinent family history.  Current Outpatient Medications on File Prior to Visit  Medication Sig Dispense Refill   albuterol (VENTOLIN HFA) 108 (90 Base) MCG/ACT inhaler Inhale 2 puffs into the lungs every 6 (six) hours as needed for wheezing or shortness of breath.     feeding supplement (ENSURE ENLIVE / ENSURE PLUS) LIQD Take 237 mLs by mouth 2 (two) times daily between meals.     ferrous sulfate 325 (65 FE) MG tablet Take 1 tablet (325 mg total) by mouth daily with breakfast. 30 tablet 2   metFORMIN (GLUCOPHAGE) 500 MG tablet Take 1 tablet (500 mg total) by mouth daily with breakfast. 30 tablet 3   Multiple Vitamin (MULTIVITAMIN) tablet Take 1 tablet by mouth daily.     No current facility-administered medications on file prior to visit.    No Known Allergies  Social History   Substance and Sexual Activity  Alcohol Use Not Currently    Social History   Tobacco Use  Smoking Status Never  Smokeless Tobacco Never    Review of Systems  Constitutional: Negative.   HENT: Negative.    Eyes: Negative.   Respiratory: Negative.    Cardiovascular: Negative.   Gastrointestinal: Negative.   Genitourinary: Negative.   Musculoskeletal: Negative.   Skin: Negative.   Neurological: Negative.   Endo/Heme/Allergies: Negative.   Psychiatric/Behavioral: Negative.      Objective   Vitals:   06/01/23 1314  BP: 134/74  Pulse: 73  Resp: 12  Temp: 98 F (36.7 C)  SpO2: 98%    Physical Exam Vitals reviewed.  Constitutional:        Appearance: Normal appearance. He is normal weight. He is not ill-appearing.  HENT:     Head: Normocephalic and atraumatic.  Cardiovascular:     Rate and Rhythm: Normal rate and regular rhythm.     Heart sounds: Normal heart sounds. No murmur heard.    No friction rub. No gallop.  Pulmonary:     Effort: Pulmonary effort is normal.     Breath sounds: Normal breath sounds.  Abdominal:      General: Abdomen is flat. Bowel sounds are normal. There is no distension.     Palpations: Abdomen is soft. There is no mass.     Tenderness: There is no abdominal tenderness. There is no guarding or rebound.     Hernia: No hernia is present.  Skin:    General: Skin is warm and dry.     Comments: All sebaceous cyst noted in the left upper back.  Sebum was expressed.  No purulent drainage was noted.  Neurological:     Mental Status: He is alert and oriented to person, place, and time.   Primary care notes reviewed  Assessment  Need for screening colonoscopy Sebaceous cyst, back.  Not currently infected. Plan  Patient is scheduled for a screening colonoscopy on 06/15/2023.  The risks and benefits of the procedure including bleeding and perforation were fully explained to the patient, who gave informed consent.  Sutabs have been prescribed. 

## 2023-06-09 ENCOUNTER — Other Ambulatory Visit: Payer: Self-pay

## 2023-06-09 ENCOUNTER — Emergency Department (HOSPITAL_COMMUNITY)
Admission: EM | Admit: 2023-06-09 | Discharge: 2023-06-10 | Disposition: A | Discharge status: Home / Self Care | Payer: Medicaid Other | Attending: Emergency Medicine | Admitting: Emergency Medicine

## 2023-06-09 ENCOUNTER — Encounter (INDEPENDENT_AMBULATORY_CARE_PROVIDER_SITE_OTHER): Payer: Self-pay | Admitting: *Deleted

## 2023-06-09 ENCOUNTER — Encounter (HOSPITAL_COMMUNITY): Payer: Self-pay

## 2023-06-09 DIAGNOSIS — R339 Retention of urine, unspecified: Secondary | ICD-10-CM | POA: Diagnosis not present

## 2023-06-09 DIAGNOSIS — R112 Nausea with vomiting, unspecified: Secondary | ICD-10-CM | POA: Insufficient documentation

## 2023-06-09 DIAGNOSIS — R1084 Generalized abdominal pain: Secondary | ICD-10-CM | POA: Diagnosis not present

## 2023-06-09 DIAGNOSIS — R109 Unspecified abdominal pain: Secondary | ICD-10-CM | POA: Diagnosis present

## 2023-06-09 LAB — URINALYSIS, ROUTINE W REFLEX MICROSCOPIC
Bacteria, UA: NONE SEEN
Bilirubin Urine: NEGATIVE
Glucose, UA: NEGATIVE mg/dL
Hgb urine dipstick: NEGATIVE
Ketones, ur: NEGATIVE mg/dL
Leukocytes,Ua: NEGATIVE
Nitrite: NEGATIVE
Protein, ur: 100 mg/dL — AB
Specific Gravity, Urine: 1.008 (ref 1.005–1.030)
pH: 8 (ref 5.0–8.0)

## 2023-06-09 LAB — COMPREHENSIVE METABOLIC PANEL
ALT: 36 U/L (ref 0–44)
AST: 36 U/L (ref 15–41)
Albumin: 4.5 g/dL (ref 3.5–5.0)
Alkaline Phosphatase: 71 U/L (ref 38–126)
Anion gap: 7 (ref 5–15)
BUN: 15 mg/dL (ref 6–20)
CO2: 28 mmol/L (ref 22–32)
Calcium: 9.2 mg/dL (ref 8.9–10.3)
Chloride: 93 mmol/L — ABNORMAL LOW (ref 98–111)
Creatinine, Ser: 0.52 mg/dL — ABNORMAL LOW (ref 0.61–1.24)
GFR, Estimated: >60 mL/min (ref >60)
Glucose, Bld: 93 mg/dL (ref 70–99)
Potassium: 4.6 mmol/L (ref 3.5–5.1)
Sodium: 128 mmol/L — ABNORMAL LOW (ref 135–145)
Total Bilirubin: 0.6 mg/dL (ref 0.3–1.2)
Total Protein: 7.8 g/dL (ref 6.5–8.1)

## 2023-06-09 LAB — CBC
HCT: 34.3 % — ABNORMAL LOW (ref 39.0–52.0)
Hemoglobin: 11.8 g/dL — ABNORMAL LOW (ref 13.0–17.0)
MCH: 29.3 pg (ref 26.0–34.0)
MCHC: 34.4 g/dL (ref 30.0–36.0)
MCV: 85.1 fL (ref 80.0–100.0)
Platelets: 235 10*3/uL (ref 150–400)
RBC: 4.03 MIL/uL — ABNORMAL LOW (ref 4.22–5.81)
RDW: 12.6 % (ref 11.5–15.5)
WBC: 13.3 10*3/uL — ABNORMAL HIGH (ref 4.0–10.5)
nRBC: 0 % (ref 0.0–0.2)

## 2023-06-09 LAB — LIPASE, BLOOD: Lipase: 24 U/L (ref 11–51)

## 2023-06-09 LAB — CBG MONITORING, ED: Glucose-Capillary: 84 mg/dL (ref 70–99)

## 2023-06-09 NOTE — ED Triage Notes (Addendum)
Pt c/o abdominal pain and emesis that started today. Can not get much information out of patient. Pt dry heaving in triage. CBG 84

## 2023-06-10 ENCOUNTER — Telehealth: Payer: Self-pay | Admitting: *Deleted

## 2023-06-10 ENCOUNTER — Other Ambulatory Visit: Payer: Self-pay

## 2023-06-10 ENCOUNTER — Emergency Department (HOSPITAL_COMMUNITY): Payer: Medicaid Other

## 2023-06-10 LAB — ETHANOL: Alcohol, Ethyl (B): <10 mg/dL (ref <10)

## 2023-06-10 LAB — TROPONIN I (HIGH SENSITIVITY): Troponin I (High Sensitivity): 3 ng/L (ref <18)

## 2023-06-10 MED ORDER — ONDANSETRON HCL 4 MG/2ML IJ SOLN
4.0000 mg | Freq: Once | INTRAMUSCULAR | Status: AC
  Administered 2023-06-10: 4 mg via INTRAVENOUS
  Filled 2023-06-10: qty 2

## 2023-06-10 MED ORDER — SODIUM CHLORIDE 0.9 % IV BOLUS
1000.0000 mL | Freq: Once | INTRAVENOUS | Status: AC
  Administered 2023-06-10: 1000 mL via INTRAVENOUS

## 2023-06-10 MED ORDER — IOHEXOL 300 MG/ML  SOLN
100.0000 mL | Freq: Once | INTRAMUSCULAR | Status: AC | PRN
  Administered 2023-06-10: 100 mL via INTRAVENOUS

## 2023-06-10 MED ORDER — TAMSULOSIN HCL 0.4 MG PO CAPS: 0.4000 mg | ORAL_CAPSULE | Freq: Every day | ORAL | 0 refills | Status: AC

## 2023-06-10 MED ORDER — METOCLOPRAMIDE HCL 5 MG/ML IJ SOLN
10.0000 mg | Freq: Once | INTRAMUSCULAR | Status: AC
  Administered 2023-06-10: 10 mg via INTRAVENOUS
  Filled 2023-06-10: qty 2

## 2023-06-10 NOTE — Telephone Encounter (Signed)
Received call from patient niece, Elane Fritz [phone] telephone (work).   Reports that patient was seen in ER for urinary retention and abdominal pain. States that patient is requesting to defer colonoscopy until after urologic issues have been addressed.   Procedure: Colonoscopy W/ Propofol CPT: 56213 Dx: Z12.11- Special screening for malignant neoplasms, colon   Offered available dates as follows: 07/16 08/06 08/13  08/20  Patient niece Elane Fritz will return call with date after discussing with patient.

## 2023-06-10 NOTE — ED Provider Notes (Signed)
[place] EMERGENCY DEPARTMENT AT [facility] Provider Note   CSN: [id number] Arrival date & time: 06/09/23  1953     History  Chief Complaint  Patient presents with   Abdominal Pain    Eric Vega is a 62 y.o. male.  Presents to the emergency department for evaluation of diffuse abdominal pain with nausea and vomiting.  Symptoms began earlier today.       Home Medications Prior to Admission medications   Medication Sig Start Date End Date Taking? Authorizing Provider  tamsulosin (FLOMAX) 0.4 MG CAPS capsule Take 1 capsule (0.4 mg total) by mouth daily after breakfast. 06/10/23  Yes Jourdyn Hasler, Canary Brim, MD  albuterol (VENTOLIN HFA) 108 (90 Base) MCG/ACT inhaler Inhale 2 puffs into the lungs every 6 (six) hours as needed for wheezing or shortness of breath.    [provider]  feeding supplement (ENSURE ENLIVE / ENSURE PLUS) LIQD Take 237 mLs by mouth 2 (two) times daily between meals. 04/25/23   Vassie Loll, MD  ferrous sulfate 325 (65 FE) MG tablet Take 1 tablet (325 mg total) by mouth daily with breakfast. 04/25/23 07/24/23  Vassie Loll, MD  metFORMIN (GLUCOPHAGE) 500 MG tablet Take 1 tablet (500 mg total) by mouth daily with breakfast. 06/07/22 06/19/23  Kendell Bane, MD  Multiple Vitamin (MULTIVITAMIN) tablet Take 1 tablet by mouth daily.    [provider]      Allergies    Patient has no known allergies.    Review of Systems   Review of Systems  Physical Exam Updated Vital Signs BP 128/68 (BP Location: Right Arm)   Pulse 77   Temp 97.6 F (36.4 C) (Oral)   Resp 14   SpO2 99%  Physical Exam Vitals and nursing note reviewed.  Constitutional:      General: He is not in acute distress.    Appearance: He is well-developed.  HENT:     Head: Normocephalic and atraumatic.     Mouth/Throat:     Mouth: Mucous membranes are moist.  Eyes:     General: Vision grossly intact. Gaze aligned appropriately.     Extraocular  Movements: Extraocular movements intact.     Conjunctiva/sclera: Conjunctivae normal.  Cardiovascular:     Rate and Rhythm: Normal rate and regular rhythm.     Pulses: Normal pulses.     Heart sounds: Normal heart sounds, S1 normal and S2 normal. No murmur heard.    No friction rub. No gallop.  Pulmonary:     Effort: Pulmonary effort is normal. No respiratory distress.     Breath sounds: Normal breath sounds.  Abdominal:     Palpations: Abdomen is soft.     Tenderness: There is generalized abdominal tenderness. There is no guarding or rebound.     Hernia: No hernia is present.  Musculoskeletal:        General: No swelling.     Cervical back: Full passive range of motion without pain, normal range of motion and neck supple. No pain with movement, spinous process tenderness or muscular tenderness. Normal range of motion.     Right lower leg: No edema.     Left lower leg: No edema.  Skin:    General: Skin is warm and dry.     Capillary Refill: Capillary refill takes less than 2 seconds.     Findings: No ecchymosis, erythema, lesion or wound.  Neurological:     Mental Status: He is alert and oriented to person, place,  and time.     GCS: GCS eye subscore is 4. GCS verbal subscore is 5. GCS motor subscore is 6.     Cranial Nerves: Cranial nerves 2-12 are intact.     Sensory: Sensation is intact.     Motor: Motor function is intact. No weakness or abnormal muscle tone.     Coordination: Coordination is intact.     ED Results / Procedures / Treatments   Labs (all labs ordered are listed, but only abnormal results are displayed) Labs Reviewed  COMPREHENSIVE METABOLIC PANEL - Abnormal; Notable for the following components:      Result Value   Sodium 128 (*)    Chloride 93 (*)    Creatinine, Ser 0.52 (*)    All other components within normal limits  CBC - Abnormal; Notable for the following components:   WBC 13.3 (*)    RBC 4.03 (*)    Hemoglobin 11.8 (*)    HCT 34.3 (*)    All  other components within normal limits  URINALYSIS, ROUTINE W REFLEX MICROSCOPIC - Abnormal; Notable for the following components:   Color, Urine STRAW (*)    Protein, ur 100 (*)    All other components within normal limits  LIPASE, BLOOD  ETHANOL  CBG MONITORING, ED  TROPONIN I (HIGH SENSITIVITY)    EKG None  Radiology CT ABDOMEN PELVIS W CONTRAST  Result Date: 06/10/2023 CLINICAL DATA:  Abdominal pain, acute, nonlocalized, vomiting EXAM: CT ABDOMEN AND PELVIS WITH CONTRAST TECHNIQUE: Multidetector CT imaging of the abdomen and pelvis was performed using the standard protocol following bolus administration of intravenous contrast. RADIATION DOSE REDUCTION: This exam was performed according to the departmental dose-optimization program which includes automated exposure control, adjustment of the mA and/or kV according to patient size and/or use of iterative reconstruction technique. CONTRAST:  OMNIPAQUE IOHEXOL 300 MG/ML  SOLN COMPARISON:  04/23/2023 FINDINGS: Lower chest: No acute abnormality. Hepatobiliary: No focal liver abnormality is seen. No gallstones, gallbladder wall thickening, or biliary dilatation. Pancreas: Unremarkable. No pancreatic ductal dilatation or surrounding inflammatory changes. Spleen: Unremarkable Adrenals/Urinary Tract: The adrenal glands are unremarkable. The kidneys are normal in size and position. The bladder is markedly distended and there is minimal bilateral hydronephrosis which may relate to changes of urinary retention or bladder outlet obstruction. No intrarenal or ureteral calculi. No perinephric inflammatory stranding or fluid collections are seen. No enhancing intrarenal masses are seen. Stomach/Bowel: Stomach is within normal limits. Appendix appears normal. No evidence of bowel wall thickening, distention, or inflammatory changes. Vascular/Lymphatic: Aortic atherosclerosis. No enlarged abdominal or pelvic lymph nodes. Reproductive: Prostate is  unremarkable. Small right hydrocele is partially visualized. Other: No abdominal wall hernia or abnormality. No abdominopelvic ascites. Musculoskeletal: No acute or significant osseous findings. IMPRESSION: 1. Markedly distended bladder with minimal bilateral hydronephrosis which may relate to changes of urinary retention or bladder outlet obstruction. 2. Small right hydrocele is partially visualized. Aortic Atherosclerosis (ICD10-I70.0). Electronically Signed   By: Helyn Numbers M.D.   On: 06/10/2023 01:07    Procedures Procedures    Medications Ordered in ED Medications  sodium chloride 0.9 % bolus 1,000 mL (0 mLs Intravenous Stopped 06/10/23 0100)  ondansetron (ZOFRAN) injection 4 mg (4 mg Intravenous Given 06/10/23 0015)  metoCLOPramide (REGLAN) injection 10 mg (10 mg Intravenous Given 06/10/23 0016)  iohexol (OMNIPAQUE) 300 MG/ML solution 100 mL (100 mLs Intravenous Contrast Given 06/10/23 0025)    ED Course/ Medical Decision Making/ A&P  Medical Decision Making Amount and/or Complexity of Data Reviewed Labs: ordered. Radiology: ordered.  Risk Prescription drug management.   Differential Diagnosis considered includes, but not limited to: Cholelithiasis; cholecystitis; cholangitis; bowel obstruction; esophagitis; gastritis; peptic ulcer disease; pancreatitis; cardiac.  Presents to the emergency department for evaluation of abdominal pain with nausea and vomiting.  Patient actively vomiting at arrival.  He was treated with antiemetics and fluids with improvement.  Blood work reassuring.  CT abdomen and pelvis does show evidence of distended bladder.  Patient had urinated just prior to going for CT, suspect bladder outlet obstruction.  Foley catheter placed, will follow-up with urology.        Final Clinical Impression(s) / ED Diagnoses Final diagnoses:  Generalized abdominal pain  Urinary retention    Rx / DC Orders ED Discharge Orders           Ordered    Ambulatory referral to Urology        06/10/23 0118    tamsulosin (FLOMAX) 0.4 MG CAPS capsule  Daily after breakfast        06/10/23 0118              Gilda Crease, MD 06/11/23 [phone]

## 2023-06-10 NOTE — ED Notes (Signed)
Patient transported to CT 

## 2023-06-10 NOTE — Discharge Instructions (Addendum)
El urlogo retirar la sonda de Scientist, research (physical sciences). Necesitas programar una cita.  [facility] [address] [place] [address] [phone]

## 2023-06-11 ENCOUNTER — Encounter (HOSPITAL_COMMUNITY): Payer: Self-pay | Admitting: Emergency Medicine

## 2023-06-11 ENCOUNTER — Emergency Department (HOSPITAL_COMMUNITY)
Admission: EM | Admit: 2023-06-11 | Discharge: 2023-06-11 | Disposition: A | Discharge status: Home / Self Care | Payer: Medicaid Other | Attending: Emergency Medicine | Admitting: Emergency Medicine

## 2023-06-11 ENCOUNTER — Other Ambulatory Visit: Payer: Self-pay

## 2023-06-11 DIAGNOSIS — Z466 Encounter for fitting and adjustment of urinary device: Secondary | ICD-10-CM | POA: Insufficient documentation

## 2023-06-11 DIAGNOSIS — R1030 Lower abdominal pain, unspecified: Secondary | ICD-10-CM | POA: Insufficient documentation

## 2023-06-11 DIAGNOSIS — Z978 Presence of other specified devices: Secondary | ICD-10-CM

## 2023-06-11 NOTE — ED Provider Notes (Signed)
[place] EMERGENCY DEPARTMENT AT [facility] Provider Note   CSN: [id number] Arrival date & time: 06/11/23  1036     History {Add pertinent medical, surgical, social history, OB history to HPI:1} Chief Complaint  Patient presents with   foley complaint    Eric Vega is a 62 y.o. male.  Patient has a Foley catheter in and wants it taken out.   Abdominal Pain      Home Medications Prior to Admission medications   Medication Sig Start Date End Date Taking? Authorizing Provider  albuterol (VENTOLIN HFA) 108 (90 Base) MCG/ACT inhaler Inhale 2 puffs into the lungs every 6 (six) hours as needed for wheezing or shortness of breath.    [provider]  feeding supplement (ENSURE ENLIVE / ENSURE PLUS) LIQD Take 237 mLs by mouth 2 (two) times daily between meals. 04/25/23   Vassie Loll, MD  ferrous sulfate 325 (65 FE) MG tablet Take 1 tablet (325 mg total) by mouth daily with breakfast. 04/25/23 07/24/23  Vassie Loll, MD  metFORMIN (GLUCOPHAGE) 500 MG tablet Take 1 tablet (500 mg total) by mouth daily with breakfast. 06/07/22 06/19/23  Kendell Bane, MD  Multiple Vitamin (MULTIVITAMIN) tablet Take 1 tablet by mouth daily.    [provider]  tamsulosin (FLOMAX) 0.4 MG CAPS capsule Take 1 capsule (0.4 mg total) by mouth daily after breakfast. 06/10/23   Pollina, Canary Brim, MD      Allergies    Patient has no known allergies.    Review of Systems   Review of Systems  Gastrointestinal:  Positive for abdominal pain.    Physical Exam Updated Vital Signs BP 138/84   Pulse 99   Temp 98.8 F (37.1 C) (Oral)   Resp 18   Ht 5\' 5"  (1.651 m)   Wt 62 kg   SpO2 100%   BMI 22.75 kg/m  Physical Exam  ED Results / Procedures / Treatments   Labs (all labs ordered are listed, but only abnormal results are displayed) Labs Reviewed - No data to display  EKG None  Radiology CT ABDOMEN PELVIS W CONTRAST  Result Date: 06/10/2023 CLINICAL  DATA:  Abdominal pain, acute, nonlocalized, vomiting EXAM: CT ABDOMEN AND PELVIS WITH CONTRAST TECHNIQUE: Multidetector CT imaging of the abdomen and pelvis was performed using the standard protocol following bolus administration of intravenous contrast. RADIATION DOSE REDUCTION: This exam was performed according to the departmental dose-optimization program which includes automated exposure control, adjustment of the mA and/or kV according to patient size and/or use of iterative reconstruction technique. CONTRAST:  OMNIPAQUE IOHEXOL 300 MG/ML  SOLN COMPARISON:  04/23/2023 FINDINGS: Lower chest: No acute abnormality. Hepatobiliary: No focal liver abnormality is seen. No gallstones, gallbladder wall thickening, or biliary dilatation. Pancreas: Unremarkable. No pancreatic ductal dilatation or surrounding inflammatory changes. Spleen: Unremarkable Adrenals/Urinary Tract: The adrenal glands are unremarkable. The kidneys are normal in size and position. The bladder is markedly distended and there is minimal bilateral hydronephrosis which may relate to changes of urinary retention or bladder outlet obstruction. No intrarenal or ureteral calculi. No perinephric inflammatory stranding or fluid collections are seen. No enhancing intrarenal masses are seen. Stomach/Bowel: Stomach is within normal limits. Appendix appears normal. No evidence of bowel wall thickening, distention, or inflammatory changes. Vascular/Lymphatic: Aortic atherosclerosis. No enlarged abdominal or pelvic lymph nodes. Reproductive: Prostate is unremarkable. Small right hydrocele is partially visualized. Other: No abdominal wall hernia or abnormality. No abdominopelvic ascites. Musculoskeletal: No acute or significant osseous findings. IMPRESSION: 1.  Markedly distended bladder with minimal bilateral hydronephrosis which may relate to changes of urinary retention or bladder outlet obstruction. 2. Small right hydrocele is partially visualized. Aortic  Atherosclerosis (ICD10-I70.0). Electronically Signed   By: Helyn Numbers M.D.   On: 06/10/2023 01:07    Procedures Procedures  {Document cardiac monitor, telemetry assessment procedure when appropriate:1}  Medications Ordered in ED Medications - No data to display  ED Course/ Medical Decision Making/ A&P   {   Click here for ABCD2, HEART and other calculatorsREFRESH Note before signing :1}                          Medical Decision Making  Foley catheter was removed without problems.  {Document critical care time when appropriate:1} {Document review of labs and clinical decision tools ie heart score, Chads2Vasc2 etc:1}  {Document your independent review of radiology images, and any outside records:1} {Document your discussion with family members, caretakers, and with consultants:1} {Document social determinants of health affecting pt's care:1} {Document your decision making why or why not admission, treatments were needed:1} Final Clinical Impression(s) / ED Diagnoses Final diagnoses:  None    Rx / DC Orders ED Discharge Orders     None

## 2023-06-11 NOTE — ED Notes (Signed)
Pt to room, leg bag noted to have clear yellow urine.  Pt called family member to interpret over phone.  Per this family member, pt  is here to have catheter removed and wants no other treatment.  Explained to pt via family member that catheter was placed due to markedly distended bladder and that removing catheter may cause the same situation again.  Pt then states that he was "peeing" just fine. Further explained to pt that it was mostly likely overflow incontinence and that his bladder was noted to be distended indicating that he was not emptying.  Pt continues to endorse wanting the catheter removed and no other treatments at this time.  Informed pt and family member tht we would discuss this with the provider.

## 2023-06-11 NOTE — ED Triage Notes (Signed)
Pt ambulatory to triage, states he was seen here on Wednesday night and had a foley placed.  Pt states catheter is painful and he was unable to sleep due to pain.  Pt states that catheter is not draining.  Interpreter used for interview, pt is poor historian.

## 2023-06-11 NOTE — Discharge Instructions (Signed)
Follow-up with [facility]. °

## 2023-06-16 NOTE — Telephone Encounter (Signed)
Call placed to patient niece Elane Fritz to follow up.   Elane Fritz states that she has not been able to contact patient at this time.

## 2023-06-16 NOTE — Telephone Encounter (Signed)
Received return call from [place].   Patient is agreeable to scheduling for 07/16.

## 2023-06-22 NOTE — Telephone Encounter (Signed)
Dr. Lovell Sheehan unavailable for procedure on 06/29/2023. Procedure to be moved to 07/20/2023.  Call placed to [place], [phone]~ telephone (work). Agreeable to date change.

## 2023-06-24 ENCOUNTER — Ambulatory Visit: Payer: Medicaid Other | Admitting: Urology

## 2023-07-05 ENCOUNTER — Ambulatory Visit: Payer: Medicaid Other | Admitting: Urology

## 2023-07-09 NOTE — H&P (Signed)
Eric Vega; [id number]; 09/11/1961   HPI Patient is a 62-year-old Hispanic male who was referred to my care by [facility] for evaluation and treatment of a sebaceous cyst on his back as well as for a screening colonoscopy.  Patient states he has had a sebaceous cyst on his back for many years.  It is not bothering him at the present time.  He denies any significant weight loss, blood per rectum, or family history of colon cancer.  He has never had a colonoscopy.  History was obtained through an interpreter. Past Medical History:  Diagnosis Date   Acute febrile illness 11/14/2022   Acute hypoxemic respiratory failure (HCC) 06/03/2022   Acute respiratory failure with hypoxia and hypercapnia (HCC) 06/03/2020   Closed fracture of multiple ribs of both sides 06/03/2020   Closed fracture of shaft of left femur with delayed healing 06/03/2020   Critical polytrauma 06/03/2020   Diabetes mellitus without complication (HCC)    Difficulty liberating from mechanical ventilation (HCC) 06/03/2020   Hemothorax with pneumothorax, traumatic 06/03/2020   Hypoalbuminemia due to protein-calorie malnutrition (HCC) 11/14/2022   Hypomagnesemia 06/04/2022   Hyponatremia 06/04/2022   Major depressive disorder with single episode, 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   Malnutrition of moderate degree 06/05/2022   Normocytic anemia 11/14/2022   Pericardial effusion 06/03/2020   Sepsis due to Enterococcus (HCC) 11/17/2022   Sepsis without acute organ dysfunction (HCC) 06/04/2022   SIRS (systemic inflammatory response syndrome) (HCC) 11/14/2022   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 No pertinent family history.  Current Outpatient Medications on File Prior to Visit  Medication Sig Dispense Refill   albuterol (VENTOLIN HFA) 108 (90 Base) MCG/ACT inhaler Inhale 2 puffs into the lungs every 6 (six) hours as needed for wheezing or shortness of breath.     feeding supplement (ENSURE ENLIVE / ENSURE PLUS) LIQD Take 237 mLs by mouth 2 (two) times daily between meals.     ferrous sulfate 325 (65 FE) MG tablet Take 1 tablet (325 mg total) by mouth daily with breakfast. 30 tablet 2   metFORMIN (GLUCOPHAGE) 500 MG tablet Take 1 tablet (500 mg total) by mouth daily with breakfast. 30 tablet 3   Multiple Vitamin (MULTIVITAMIN) tablet Take 1 tablet by mouth daily.     No current facility-administered medications on file prior to visit.    No Known Allergies  Social History   Substance and Sexual Activity  Alcohol Use Not Currently    Social History   Tobacco Use  Smoking Status Never  Smokeless Tobacco Never    Review of Systems  Constitutional: Negative.   HENT: Negative.    Eyes: Negative.   Respiratory: Negative.    Cardiovascular: Negative.   Gastrointestinal: Negative.   Genitourinary: Negative.   Musculoskeletal: Negative.   Skin: Negative.   Neurological: Negative.   Endo/Heme/Allergies: Negative.   Psychiatric/Behavioral: Negative.      Objective   Vitals:   06/01/23 1314  BP: 134/74  Pulse: 73  Resp: 12  Temp: 98 F (36.7 C)  SpO2: 98%    Physical Exam Vitals reviewed.  Constitutional:        Appearance: Normal appearance. He is normal weight. He is not ill-appearing.  HENT:     Head: Normocephalic and atraumatic.  Cardiovascular:     Rate and Rhythm: Normal rate and regular rhythm.     Heart sounds: Normal heart sounds. No murmur heard.    No friction rub. No gallop.  Pulmonary:     Effort: Pulmonary effort is normal.     Breath sounds: Normal breath sounds.  Abdominal:      General: Abdomen is flat. Bowel sounds are normal. There is no distension.     Palpations: Abdomen is soft. There is no mass.     Tenderness: There is no abdominal tenderness. There is no guarding or rebound.     Hernia: No hernia is present.  Skin:    General: Skin is warm and dry.     Comments: All sebaceous cyst noted in the left upper back.  Sebum was expressed.  No purulent drainage was noted.  Neurological:     Mental Status: He is alert and oriented to person, place, and time.   Primary care notes reviewed  Assessment  Need for screening colonoscopy Sebaceous cyst, back.  Not currently infected. Plan  Patient is scheduled for a screening colonoscopy on 06/15/2023.  The risks and benefits of the procedure including bleeding and perforation were fully explained to the patient, who gave informed consent.  Sutabs have been prescribed. 

## 2023-07-13 ENCOUNTER — Ambulatory Visit: Payer: Medicaid Other | Admitting: Gastroenterology

## 2023-07-16 ENCOUNTER — Other Ambulatory Visit: Payer: Self-pay

## 2023-07-16 ENCOUNTER — Emergency Department (HOSPITAL_COMMUNITY): Payer: Medicaid Other

## 2023-07-16 ENCOUNTER — Encounter (HOSPITAL_COMMUNITY): Payer: Self-pay

## 2023-07-16 ENCOUNTER — Emergency Department (HOSPITAL_COMMUNITY)
Admission: EM | Admit: 2023-07-16 | Discharge: 2023-07-16 | Disposition: A | Discharge status: Home / Self Care | Payer: Medicaid Other | Attending: Emergency Medicine | Admitting: Emergency Medicine

## 2023-07-16 DIAGNOSIS — Z20822 Contact with and (suspected) exposure to covid-19: Secondary | ICD-10-CM | POA: Insufficient documentation

## 2023-07-16 DIAGNOSIS — R059 Cough, unspecified: Secondary | ICD-10-CM | POA: Diagnosis not present

## 2023-07-16 DIAGNOSIS — Z7984 Long term (current) use of oral hypoglycemic drugs: Secondary | ICD-10-CM | POA: Insufficient documentation

## 2023-07-16 DIAGNOSIS — R1084 Generalized abdominal pain: Secondary | ICD-10-CM | POA: Diagnosis not present

## 2023-07-16 DIAGNOSIS — R1012 Left upper quadrant pain: Secondary | ICD-10-CM | POA: Diagnosis present

## 2023-07-16 DIAGNOSIS — E119 Type 2 diabetes mellitus without complications: Secondary | ICD-10-CM | POA: Insufficient documentation

## 2023-07-16 DIAGNOSIS — R0602 Shortness of breath: Secondary | ICD-10-CM | POA: Insufficient documentation

## 2023-07-16 LAB — CBC WITH DIFFERENTIAL/PLATELET
Abs Immature Granulocytes: 0.01 10*3/uL (ref 0.00–0.07)
Basophils Absolute: 0.1 10*3/uL (ref 0.0–0.1)
Basophils Relative: 1 %
Eosinophils Absolute: 0.5 10*3/uL (ref 0.0–0.5)
Eosinophils Relative: 6 %
HCT: 34.7 % — ABNORMAL LOW (ref 39.0–52.0)
Hemoglobin: 11.7 g/dL — ABNORMAL LOW (ref 13.0–17.0)
Immature Granulocytes: 0 %
Lymphocytes Relative: 24 %
Lymphs Abs: 1.9 10*3/uL (ref 0.7–4.0)
MCH: 29.6 pg (ref 26.0–34.0)
MCHC: 33.7 g/dL (ref 30.0–36.0)
MCV: 87.8 fL (ref 80.0–100.0)
Monocytes Absolute: 0.5 10*3/uL (ref 0.1–1.0)
Monocytes Relative: 7 %
Neutro Abs: 4.9 10*3/uL (ref 1.7–7.7)
Neutrophils Relative %: 62 %
Platelets: 200 10*3/uL (ref 150–400)
RBC: 3.95 MIL/uL — ABNORMAL LOW (ref 4.22–5.81)
RDW: 12.6 % (ref 11.5–15.5)
WBC: 7.9 10*3/uL (ref 4.0–10.5)
nRBC: 0 % (ref 0.0–0.2)

## 2023-07-16 LAB — COMPREHENSIVE METABOLIC PANEL
ALT: 22 U/L (ref 0–44)
AST: 18 U/L (ref 15–41)
Albumin: 3.9 g/dL (ref 3.5–5.0)
Alkaline Phosphatase: 64 U/L (ref 38–126)
Anion gap: 4 — ABNORMAL LOW (ref 5–15)
BUN: 13 mg/dL (ref 6–20)
CO2: 30 mmol/L (ref 22–32)
Calcium: 8.6 mg/dL — ABNORMAL LOW (ref 8.9–10.3)
Chloride: 97 mmol/L — ABNORMAL LOW (ref 98–111)
Creatinine, Ser: 0.68 mg/dL (ref 0.61–1.24)
GFR, Estimated: >60 mL/min (ref >60)
Glucose, Bld: 120 mg/dL — ABNORMAL HIGH (ref 70–99)
Potassium: 4.5 mmol/L (ref 3.5–5.1)
Sodium: 131 mmol/L — ABNORMAL LOW (ref 135–145)
Total Bilirubin: 0.4 mg/dL (ref 0.3–1.2)
Total Protein: 7 g/dL (ref 6.5–8.1)

## 2023-07-16 LAB — URINALYSIS, ROUTINE W REFLEX MICROSCOPIC
Bilirubin Urine: NEGATIVE
Glucose, UA: NEGATIVE mg/dL
Hgb urine dipstick: NEGATIVE
Ketones, ur: NEGATIVE mg/dL
Leukocytes,Ua: NEGATIVE
Nitrite: NEGATIVE
Protein, ur: NEGATIVE mg/dL
Specific Gravity, Urine: 1.009 (ref 1.005–1.030)
pH: 8 (ref 5.0–8.0)

## 2023-07-16 LAB — LIPASE, BLOOD: Lipase: 23 U/L (ref 11–51)

## 2023-07-16 LAB — RESP PANEL BY RT-PCR (RSV, FLU A&B, COVID)  RVPGX2
Influenza A by PCR: NEGATIVE
Influenza B by PCR: NEGATIVE
Resp Syncytial Virus by PCR: NEGATIVE
SARS Coronavirus 2 by RT PCR: NEGATIVE

## 2023-07-16 MED ORDER — IOHEXOL 300 MG/ML  SOLN
80.0000 mL | Freq: Once | INTRAMUSCULAR | Status: AC | PRN
  Administered 2023-07-16: 80 mL via INTRAVENOUS

## 2023-07-16 MED ORDER — ONDANSETRON HCL 4 MG/2ML IJ SOLN
4.0000 mg | Freq: Once | INTRAMUSCULAR | Status: AC
  Administered 2023-07-16: 4 mg via INTRAVENOUS
  Filled 2023-07-16: qty 2

## 2023-07-16 MED ORDER — MORPHINE SULFATE (PF) 4 MG/ML IV SOLN
4.0000 mg | Freq: Once | INTRAVENOUS | Status: AC
  Administered 2023-07-16: 4 mg via INTRAVENOUS
  Filled 2023-07-16: qty 1

## 2023-07-16 MED ORDER — ENSURE ACTIVE HEART HEALTH PO LIQD: 1.0000 | Freq: Two times a day (BID) | ORAL | 2 refills | Status: AC

## 2023-07-16 NOTE — ED Provider Notes (Signed)
[facility] AT [facility] Provider Note   CSN: [id number] Arrival date & time: 07/16/23  9528     History {Add pertinent medical, surgical, social history, OB history to HPI:1} Chief Complaint  Patient presents with   Abdominal Pain    Eric Vega is a 62 y.o. 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  He endorses some shortness of breath, denies fevers or chills.  Today he developed left upper quadrant pain along with nausea with dry heaves but no active emesis.  He denies fevers or chills, his pain is localized to the left upper quadrant and described as sharp without radiation.  He denies dysuria, flank pain, diarrhea.  He has had no p.o. intake today secondary to his symptoms.  He last ate a meal yesterday and states he tolerated it well.  He has had no treatment for his symptoms, has found no alleviators or aggravators.  Of note, noted in triage sore throat but denies for me.  The history is provided by the patient. The history is limited by a language barrier. A language interpreter was used.       Home Medications Prior to Admission medications   Medication Sig Start Date End Date Taking? Authorizing Provider  albuterol (VENTOLIN HFA) 108 (90 Base) MCG/ACT inhaler Inhale 2 puffs into the lungs every 6 (six) hours as needed for wheezing or shortness of breath.   Yes [provider]  tamsulosin (FLOMAX) 0.4 MG CAPS capsule Take 1 capsule (0.4 mg total) by mouth daily after breakfast. 06/10/23  Yes Pollina, Canary Brim, MD  feeding supplement (ENSURE ENLIVE / ENSURE PLUS) LIQD Take 237 mLs by mouth 2 (two) times daily between meals. 04/25/23   Vassie Loll, MD  ferrous sulfate 325 (65 FE) MG tablet Take 1 tablet (325 mg total) by mouth daily with breakfast. 04/25/23 07/24/23  Vassie Loll, MD  metFORMIN (GLUCOPHAGE) 500 MG tablet Take 1 tablet (500 mg total) by mouth daily with breakfast. 06/07/22 06/19/23  Kendell Bane, MD  Multiple Vitamin (MULTIVITAMIN) tablet Take 1 tablet by mouth daily.    [provider]      Allergies    Patient has no known allergies.    Review of Systems   Review of Systems  Constitutional:  Negative for chills and fever.  HENT:  Negative for congestion and sore throat.   Eyes: Negative.   Respiratory:  Positive for cough and shortness of breath. Negative for chest tightness.   Cardiovascular:  Negative for chest pain.  Gastrointestinal:  Positive for abdominal pain and nausea. Negative for diarrhea and vomiting.  Genitourinary: Negative.   Musculoskeletal:  Negative for arthralgias, joint swelling and neck pain.  Skin: Negative.  Negative for rash and wound.  Neurological:  Negative for dizziness, weakness, light-headedness, numbness and headaches.  Psychiatric/Behavioral: Negative.      Physical Exam Updated Vital Signs BP 134/76   Pulse 78   Temp 97.7 F (36.5 C) (Oral)   Resp 16   Ht 5\' 5"  (1.651 m)   Wt 61.7 kg   SpO2 100%   BMI 22.63 kg/m  Physical Exam Vitals and nursing note reviewed.  Constitutional:      Appearance: He is well-developed.  HENT:  Head: Normocephalic and atraumatic.  Eyes:     Conjunctiva/sclera: Conjunctivae normal.  Cardiovascular:     Rate and Rhythm: Normal rate and regular rhythm.     Heart sounds: Normal heart sounds.  Pulmonary:     Effort: Pulmonary effort is normal.     Breath sounds: Normal breath sounds. No wheezing or rhonchi.     Comments: Several episodes of wet sounding cough during exam.  No wheezing, no rhonchi. Abdominal:     General: Bowel sounds are normal. There is no distension.     Palpations: Abdomen is soft.      Tenderness: There is abdominal tenderness in the left upper quadrant. There is no guarding or rebound.  Musculoskeletal:        General: Normal range of motion.     Cervical back: Normal range of motion.  Skin:    General: Skin is warm and dry.  Neurological:     Mental Status: He is alert.     ED Results / Procedures / Treatments   Labs (all labs ordered are listed, but only abnormal results are displayed) Labs Reviewed  LIPASE, BLOOD  COMPREHENSIVE METABOLIC PANEL  CBC  URINALYSIS, ROUTINE W REFLEX MICROSCOPIC    EKG None  Radiology No results found.  Procedures Procedures  {Document cardiac monitor, telemetry assessment procedure when appropriate:1}  Medications Ordered in ED Medications - No data to display  ED Course/ Medical Decision Making/ A&P   {   Click here for ABCD2, HEART and other calculatorsREFRESH Note before signing :1}                              Medical Decision Making Amount and/or Complexity of Data Reviewed Labs: ordered.   ***  {Document critical care time when appropriate:1} {Document review of labs and clinical decision tools ie heart score, Chads2Vasc2 etc:1}  {Document your independent review of radiology images, and any outside records:1} {Document your discussion with family members, caretakers, and with consultants:1} {Document social determinants of health affecting pt's care:1} {Document your decision making why or why not admission, treatments were needed:1} Final Clinical Impression(s) / ED Diagnoses Final diagnoses:  None    Rx / DC Orders ED Discharge Orders     None

## 2023-07-16 NOTE — Discharge Instructions (Signed)
Your CT scan and your lab tests are today with no obvious reason for your abdominal pain.  I do recommend supplementing your diet with Ensure supplement if you are unable to tolerate solid food.  You do need follow-up care with a gastroenterologist for further evaluation of this problem.  I have referred you to Dr. Tasia Catchings, call his office for an appointment.

## 2023-07-16 NOTE — ED Triage Notes (Signed)
Cough and sore throat x4 days Stomach inflammation and vomiting  Per interpretor.   Spanish speaking only

## 2023-07-16 NOTE — ED Notes (Signed)
Patient transported to CT 

## 2023-07-16 NOTE — ED Notes (Signed)
X-ray at bedside

## 2023-07-17 ENCOUNTER — Telehealth (HOSPITAL_COMMUNITY): Payer: Self-pay | Admitting: Emergency Medicine

## 2023-07-17 MED ORDER — PANTOPRAZOLE SODIUM 20 MG PO TBEC
20.0000 mg | DELAYED_RELEASE_TABLET | Freq: Every day | ORAL | 0 refills | Status: DC
Start: 2023-07-17 — End: 2024-03-05

## 2023-07-17 NOTE — Telephone Encounter (Cosign Needed)
After pt dispo,  determined pt may benefit from PPI, protonix e scribed. Spoke with Diplomatic Services operational officer who will advise pt of new script at his pharmacy.

## 2023-07-20 ENCOUNTER — Ambulatory Visit (HOSPITAL_COMMUNITY): Admission: RE | Admit: 2023-07-20 | Payer: Medicaid Other | Source: Home / Self Care | Admitting: General Surgery

## 2023-07-20 SURGERY — COLONOSCOPY WITH PROPOFOL
Anesthesia: Moderate Sedation

## 2023-07-28 NOTE — Progress Notes (Signed)
GI Office Note    Referring Provider: Shelby Dubin, FNP Primary Care Physician:  Shelby Dubin, FNP  Primary Gastroenterologist: Hennie Duos. Marletta Lor, DO  Chief Complaint   Chief Complaint  Patient presents with   Dysphagia    Food is getting stuck in throat.    History of Present Illness   Eric Vega is a 62 y.o. male presenting today at the request of [name], [name], FNP for dysphagia.  ED visit 06/09/2023 for diffuse abdominal pain with nausea and vomiting.  Presented with active vomiting and was treated with antiemetics and fluids.  Underwent CT A/P as noted below.  Given evidence of distended bladder on CT scan after urination Foley was placed and advised to follow-up with urology given concern for bladder outlet obstruction.  Was prescribed tamsulosin.  WBC mildly elevated at 13.3 and 1111.8.  CT A/P 06/10/2023: -Normal hepatobiliary system and pancreas -Normal stomach and bowel -Markedly distended bladder with minimal bilateral hydronephrosis (concern for bladder outlet obstruction)   ED visit 07/16/2023 with complaint of 4-day history of cough and productive sputum.  Endorsed shortness of breath and also mention left upper quadrant pain along with nausea and dry heaves.  Denied any dysuria, flank pain, or diarrhea.  He was noted to have some abdominal tenderness on exam.  Patient underwent CT scan as noted below.  Symptoms resolved after receiving morphine and Zofran and IV fluids.  On discharged patient mention he was having difficulty swallowing solid food and had only been eating very soft foods which was going on for several months.  Stated he discussed this with his PCP who was going to refer to GI for referral however referral had not been sent.  Decided to add PPI to patient's regimen and advised patient to see nutrition.  CT A/P 07/16/2023: -No acute abnormality in the abdomen or pelvis. -Normal stomach, appendix, bladder, and kidneys.  Also normal adrenal  glands.   Today:  Since the accident and he had the tracheostomy he states he had some damage in his throat and that is bothering him.   He does not eat solid foods. He has only been eating soft foods. He feels like it is coming up again. Has never had an upper endoscopy. He does report a lot of barium swallows in the past. Denies any chest pain or shortness of breath. When the pills are large we can swallow them. No issues with liquids. Initially after hospital he was doing okay and right now feels like it is getting progressively worse over time and his throat is swollen. Has been doing primarily soft foods for about 2 years. He report she was about 280 prior to the accident and now is 142 lbs. He does have a low appetite. He is able to eat ground meat. He does drink ensure  - about 2 daily. No melena or brbpr. He has some issues with voice changes recently as well.   He does have constipation. Sometimes will go 2 weeks without a BM. Has taken chocolate ex lax as needed.   Has never had a colonoscopy. Denies family history of cancer or polyps.   Not taking iron due to medicaid not paying for it.    Current Outpatient Medications  Medication Sig Dispense Refill   albuterol (VENTOLIN HFA) 108 (90 Base) MCG/ACT inhaler Inhale 2 puffs into the lungs every 6 (six) hours as needed for wheezing or shortness of breath.     metFORMIN (GLUCOPHAGE) 500 MG tablet  Take 1 tablet (500 mg total) by mouth daily with breakfast. 30 tablet 3   Multiple Vitamin (MULTIVITAMIN) tablet Take 1 tablet by mouth daily.     Nutritional Supplements (ENSURE ACTIVE HEART HEALTH) LIQD Take 1 Bottle by mouth 2 (two) times daily. Drink 1 bottle twice daily as a snack between meals 28413 mL 2   pantoprazole (PROTONIX) 20 MG tablet Take 1 tablet (20 mg total) by mouth daily. 30 tablet 0   tamsulosin (FLOMAX) 0.4 MG CAPS capsule Take 1 capsule (0.4 mg total) by mouth daily after breakfast. 30 capsule 0   ferrous sulfate 325 (65  FE) MG tablet Take 1 tablet (325 mg total) by mouth daily with breakfast. (Patient not taking: Reported on 07/29/2023) 30 tablet 2   No current facility-administered medications for this visit.    Past Medical History:  Diagnosis Date   Acute febrile illness 11/14/2022   Acute hypoxemic respiratory failure (HCC) 06/03/2022   Acute respiratory failure with hypoxia and hypercapnia (HCC) 06/03/2020   Closed fracture of multiple ribs of both sides 06/03/2020   Closed fracture of shaft of left femur with delayed healing 06/03/2020   Critical polytrauma 06/03/2020   Diabetes mellitus without complication (HCC)    Difficulty liberating from mechanical ventilation (HCC) 06/03/2020   Hemothorax with pneumothorax, traumatic 06/03/2020   Hypoalbuminemia due to protein-calorie malnutrition (HCC) 11/14/2022   Hypomagnesemia 06/04/2022   Hyponatremia 06/04/2022   Major depressive disorder with single episode, 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   Malnutrition of moderate degree 06/05/2022   Normocytic anemia 11/14/2022   Pericardial effusion 06/03/2020   Sepsis due to Enterococcus (HCC) 11/17/2022   Sepsis without acute organ dysfunction (HCC) 06/04/2022   SIRS (systemic inflammatory response syndrome) (HCC) 11/14/2022   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/03/2020   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  Allergies as of 07/29/2023   (No Known Allergies)    Social History   Socioeconomic History   Marital status: Single    Spouse name: Not on file   Number of children: Not on file   Years of education: Not on file   Highest education level: Not on file   Occupational History   Not on file  Tobacco Use   Smoking status: Never   Smokeless tobacco: Never  Vaping Use   Vaping status: Never Used  Substance and Sexual Activity   Alcohol use: Not Currently   Drug use: Not Currently   Sexual activity: Not on file  Other Topics Concern   Not on file  Social History Narrative   Not on file   Social Determinants of Health   Financial Resource Strain: Not on file  Food Insecurity: No Food Insecurity (11/14/2022)   Hunger Vital Sign    Worried About Running Out of Food in the Last Year: Never true    Ran Out of Food in the Last Year: Never true  Transportation Needs: No Transportation Needs (11/14/2022)   PRAPARE - Administrator, Civil Service (Medical): No    Lack of Transportation (Non-Medical): No  Physical Activity: Not on file  Stress: Not on file  Social Connections: Not  on file  Intimate Partner Violence: Not At Risk (11/14/2022)   Humiliation, Afraid, Rape, and Kick questionnaire    Fear of Current or Ex-Partner: No    Emotionally Abused: No    Physically Abused: No    Sexually Abused: No    Review of Systems   Gen: Denies any fever, chills, fatigue, weight loss, lack of appetite.  CV: Denies chest pain, heart palpitations, peripheral edema, syncope.  Resp: Denies shortness of breath at rest or with exertion. Denies wheezing or cough.  GI: see HPI GU : Denies urinary burning, urinary frequency, urinary hesitancy MS: Denies joint pain, muscle weakness, cramps, or limitation of movement.  Derm: Denies rash, itching, dry skin Psych: Denies depression, anxiety, memory loss, and confusion Heme: Denies bruising, bleeding, and enlarged lymph nodes.  Physical Exam   BP 137/74 (BP Location: Right Arm, Patient Position: Sitting, Cuff Size: Normal)   Pulse 65   Temp 97.6 F (36.4 C) (Temporal)   Ht 5\' 5"  (1.651 m)   Wt 142 lb (64.4 kg)   SpO2 99%   BMI 23.63 kg/m   General:   Alert and oriented. Pleasant and  cooperative. Well-nourished and well-developed.  Head:  Normocephalic and atraumatic. Eyes:  Without icterus, sclera clear and conjunctiva pink.  Ears:  Normal auditory acuity. Mouth:  No deformity or lesions, oral mucosa pink.  Lungs:  Clear to auscultation bilaterally. No wheezes, rales, or rhonchi. No distress.  Heart:  S1, S2 present without murmurs appreciated.  Abdomen:  +BS, soft, non-tender and non-distended. No HSM noted. No guarding or rebound. No masses appreciated.  Rectal:  Deferred  Msk:  Symmetrical without gross deformities. Normal posture. Extremities:  Without edema. Neurologic:  Alert and  oriented x4;  grossly normal neurologically. Skin:  Intact without significant lesions or rashes. Psych:  Alert and cooperative. Normal mood and affect.  Assessment   Eric Vega is a 62 y.o. male with a history of type 2 diabetes, respiratory failure with prior tracheostomy (currently not present), left lung transplant s/p critical polytrauma presenting today with complaint of dysphagia.   Dysphagia: Has been experiencing dysphagia with solids since shortly after his accident and tracheostomy being removed. He has had multiple barium swallow studies performed in 2021 during and after his hospitalization (unable to view hospital admission documents). He has never had an upper endoscopy. Could be having difficulty due to scar tissue from long term intubation and tracheostomy. Needs EGD for further evaluation however he does not have any family or friends that could bring him to his procedure at this time and he generally uses public transportation services. Will need to reach out to PCP to see if social work has any resources for patient to have a companion or assistance to accompany him for procedures. In the meantime will increase PPI from 20mg  to 40 mg daily.   Constipation: Can go up to 2 weeks without a BM. Has only tried chocolate ex lax. Advised fiber supplement and miralax daily.  Likely will need something more aggressive but will start with once daily and reassess.   Anemia: Baseline Hgb has been in the 10-11 range for over a year. Denies any melena or brbpr. Denies NSAID use. As stated above he is in need of endoscopic evaluation given no prior EGD or colonoscopy.This is first step in evaluation of his anemia. He will continue daily iron therapy.   Screening colon cancer: No prior colonoscopy on file. Denies family history of colon cancer or colon polyps. Needs  colonoscopy but as stated above - difficulty in finding chaperone and transportation. If unable to perform then at minimum need to consider cologuard.   PLAN   Miralax 17g once daily Increase pantoprazole to 40 mg once daily.  Daily fiber supplement May need social work consult to have someone to accompany  to do procedure . Will ask PCP to coordinate.  Needs EGD and TCS in future if able to get someone to accompany Will consider speech therapy with [facility] if unable to do EGD. Continue iron daily.  Follow up in 6 weeks.    Brooke Bonito, MSN, FNP-BC, AGACNP-BC [facility]

## 2023-07-29 ENCOUNTER — Ambulatory Visit: Payer: Medicaid Other | Admitting: Gastroenterology

## 2023-07-29 ENCOUNTER — Encounter: Payer: Self-pay | Admitting: Gastroenterology

## 2023-07-29 VITALS — BP 137/74 | HR 65 | Temp 97.6°F | Ht 65.0 in | Wt 142.0 lb

## 2023-07-29 DIAGNOSIS — K5900 Constipation, unspecified: Secondary | ICD-10-CM | POA: Diagnosis not present

## 2023-07-29 DIAGNOSIS — R1310 Dysphagia, unspecified: Secondary | ICD-10-CM | POA: Diagnosis not present

## 2023-07-29 DIAGNOSIS — D509 Iron deficiency anemia, unspecified: Secondary | ICD-10-CM

## 2023-07-29 MED ORDER — PANTOPRAZOLE SODIUM 40 MG PO TBEC: 40.0000 mg | DELAYED_RELEASE_TABLET | Freq: Every day | ORAL | 2 refills | Status: DC

## 2023-07-29 NOTE — Patient Instructions (Addendum)
Please start taking miralax 17g once daily (1 capful) in 8 oz of water or coffee.   Please increase fiber in your diet as well.  You may also start a fiber supplement such as Benefiber.  I would you to increase your pantoprazole from 20 mg once daily to 40 mg once daily, 30 minutes prior to breakfast.  I will send a message to your primary care provider to see if she can get social work involved to see if there are resources for you to get an agent that could accompany you to procedures.  If we are unable to get procedure scheduled then I will refer you to speech therapy for a modified barium swallow study.  Follow-up in 6 weeks.  It was a pleasure to see you today. I want to create trusting relationships with patients. If you receive a survey regarding your visit,  I greatly appreciate you taking time to fill this out on paper or through your MyChart. I value your feedback.  Eric Bonito, MSN, FNP-BC, AGACNP-BC [facility]  Espanol:  Empiece a tomar miralax 17 g [name] al da (1 tapa) en 8 oz de agua o caf.   Aumente tambin la fibra en su dieta.  Tambin puedes empezar a tomar un suplemento de Midwife.  Le recomendara que aumentara su pantoprazol de 20 mg una vez al da a 40 mg una vez al da, 30 minutos antes del desayuno.  Le enviar un mensaje a su proveedor de atencin primaria para ver si puede involucrar al [employer] social para ver si hay recursos para que usted [place] un agente que pueda acompaarlo a los procedimientos.  Si no podemos programar el procedimiento, lo derivar a terapia del habla para un estudio de trago de bario modificado.  Seguimiento en 6 semanas.  Fue un Arboriculturist. [name] crear relaciones de confianza con los [place]. Si recibe [employer] su visita, le agradezco mucho que se tome el tiempo para completarla en papel o a travs de MyChart. Valoro tus comentarios.  Eric Bonito, MSN, FNP-BC,  AGACNP-BC [facility] [place]

## 2023-08-12 ENCOUNTER — Ambulatory Visit: Payer: Medicaid Other | Admitting: Urology

## 2023-08-28 ENCOUNTER — Other Ambulatory Visit: Payer: Self-pay

## 2023-08-28 ENCOUNTER — Emergency Department (HOSPITAL_COMMUNITY)
Admission: EM | Admit: 2023-08-28 | Discharge: 2023-08-28 | Disposition: A | Discharge status: Home / Self Care | Payer: Medicaid Other | Attending: Emergency Medicine | Admitting: Emergency Medicine

## 2023-08-28 ENCOUNTER — Emergency Department (HOSPITAL_COMMUNITY): Payer: Medicaid Other

## 2023-08-28 ENCOUNTER — Encounter (HOSPITAL_COMMUNITY): Payer: Self-pay

## 2023-08-28 DIAGNOSIS — K5900 Constipation, unspecified: Secondary | ICD-10-CM | POA: Diagnosis not present

## 2023-08-28 DIAGNOSIS — R1084 Generalized abdominal pain: Secondary | ICD-10-CM | POA: Diagnosis not present

## 2023-08-28 DIAGNOSIS — R109 Unspecified abdominal pain: Secondary | ICD-10-CM | POA: Diagnosis present

## 2023-08-28 DIAGNOSIS — E119 Type 2 diabetes mellitus without complications: Secondary | ICD-10-CM | POA: Insufficient documentation

## 2023-08-28 DIAGNOSIS — Z7984 Long term (current) use of oral hypoglycemic drugs: Secondary | ICD-10-CM | POA: Insufficient documentation

## 2023-08-28 LAB — CBC WITH DIFFERENTIAL/PLATELET
Abs Immature Granulocytes: 0.04 10*3/uL (ref 0.00–0.07)
Basophils Absolute: 0.1 10*3/uL (ref 0.0–0.1)
Basophils Relative: 1 %
Eosinophils Absolute: 0.4 10*3/uL (ref 0.0–0.5)
Eosinophils Relative: 4 %
HCT: 35 % — ABNORMAL LOW (ref 39.0–52.0)
Hemoglobin: 11.6 g/dL — ABNORMAL LOW (ref 13.0–17.0)
Immature Granulocytes: 0 %
Lymphocytes Relative: 23 %
Lymphs Abs: 2.3 10*3/uL (ref 0.7–4.0)
MCH: 29.3 pg (ref 26.0–34.0)
MCHC: 33.1 g/dL (ref 30.0–36.0)
MCV: 88.4 fL (ref 80.0–100.0)
Monocytes Absolute: 0.6 10*3/uL (ref 0.1–1.0)
Monocytes Relative: 6 %
Neutro Abs: 6.5 10*3/uL (ref 1.7–7.7)
Neutrophils Relative %: 66 %
Platelets: 233 10*3/uL (ref 150–400)
RBC: 3.96 MIL/uL — ABNORMAL LOW (ref 4.22–5.81)
RDW: 12.4 % (ref 11.5–15.5)
WBC: 9.8 10*3/uL (ref 4.0–10.5)
nRBC: 0 % (ref 0.0–0.2)

## 2023-08-28 LAB — COMPREHENSIVE METABOLIC PANEL
ALT: 18 U/L (ref 0–44)
AST: 18 U/L (ref 15–41)
Albumin: 3.7 g/dL (ref 3.5–5.0)
Alkaline Phosphatase: 72 U/L (ref 38–126)
Anion gap: 8 (ref 5–15)
BUN: 18 mg/dL (ref 6–20)
CO2: 25 mmol/L (ref 22–32)
Calcium: 8.7 mg/dL — ABNORMAL LOW (ref 8.9–10.3)
Chloride: 93 mmol/L — ABNORMAL LOW (ref 98–111)
Creatinine, Ser: 0.7 mg/dL (ref 0.61–1.24)
GFR, Estimated: >60 mL/min (ref >60)
Glucose, Bld: 110 mg/dL — ABNORMAL HIGH (ref 70–99)
Potassium: 4.4 mmol/L (ref 3.5–5.1)
Sodium: 126 mmol/L — ABNORMAL LOW (ref 135–145)
Total Bilirubin: 0.4 mg/dL (ref 0.3–1.2)
Total Protein: 7 g/dL (ref 6.5–8.1)

## 2023-08-28 LAB — LIPASE, BLOOD: Lipase: 24 U/L (ref 11–51)

## 2023-08-28 MED ORDER — SODIUM CHLORIDE 0.9 % IV BOLUS
1000.0000 mL | Freq: Once | INTRAVENOUS | Status: AC
  Administered 2023-08-28: 1000 mL via INTRAVENOUS

## 2023-08-28 MED ORDER — IOHEXOL 300 MG/ML  SOLN
80.0000 mL | Freq: Once | INTRAMUSCULAR | Status: AC | PRN
  Administered 2023-08-28: 80 mL via INTRAVENOUS

## 2023-08-28 MED ORDER — IOHEXOL 9 MG/ML PO SOLN
ORAL | Status: AC
  Filled 2023-08-28: qty 500

## 2023-08-28 MED ORDER — SENNOSIDES-DOCUSATE SODIUM 8.6-50 MG PO TABS: 2.0000 | ORAL_TABLET | Freq: Two times a day (BID) | ORAL | 0 refills | Status: AC

## 2023-08-28 NOTE — ED Provider Notes (Signed)
[place] EMERGENCY DEPARTMENT AT [facility] Provider Note   CSN: [id number] Arrival date & time: 08/28/23  1526     History Chief Complaint  Patient presents with   Abdominal Pain    Eric Vega is a 62 y.o. male. Patient with history of diabetes, sepsis, respiratory failure, and depression who presents to the ED with concerns of abdominal pain. Reports no BM in last 4 days. States he has not been passing gas. Denies any vomiting or diarrhea. History of chronic constipation. No recent fevers, focal abdominal pain, fevers, or urinary symptoms.    Abdominal Pain      Home Medications Prior to Admission medications   Medication Sig Start Date End Date Taking? Authorizing Provider  senna-docusate (SENOKOT-S) 8.6-50 MG tablet Take 2 tablets by mouth 2 (two) times daily for 7 days. 08/28/23 09/04/23 Yes Smitty Knudsen, PA-C  albuterol (VENTOLIN HFA) 108 (90 Base) MCG/ACT inhaler Inhale 2 puffs into the lungs every 6 (six) hours as needed for wheezing or shortness of breath.    [provider]  ferrous sulfate 325 (65 FE) MG tablet Take 1 tablet (325 mg total) by mouth daily with breakfast. Patient not taking: Reported on 07/29/2023 04/25/23 07/24/23  Vassie Loll, MD  metFORMIN (GLUCOPHAGE) 500 MG tablet Take 1 tablet (500 mg total) by mouth daily with breakfast. 06/07/22 07/29/23  Kendell Bane, MD  Multiple Vitamin (MULTIVITAMIN) tablet Take 1 tablet by mouth daily.    [provider]  pantoprazole (PROTONIX) 20 MG tablet Take 1 tablet (20 mg total) by mouth daily. 07/17/23   Burgess Amor, PA-C  pantoprazole (PROTONIX) 40 MG tablet Take 1 tablet (40 mg total) by mouth daily. 07/29/23   Aida Raider, NP  tamsulosin (FLOMAX) 0.4 MG CAPS capsule Take 1 capsule (0.4 mg total) by mouth daily after breakfast. 06/10/23   Pollina, Canary Brim, MD      Allergies    Patient has no known allergies.    Review of Systems   Review of Systems   Gastrointestinal:  Positive for abdominal pain.  All other systems reviewed and are negative.   Physical Exam Updated Vital Signs BP 138/77   Pulse 78   Temp 98.4 F (36.9 C)   Resp 18   Ht 5\' 5"  (1.651 m)   Wt 47.6 kg   SpO2 98%   BMI 17.47 kg/m  Physical Exam Vitals and nursing note reviewed.  Constitutional:      General: He is not in acute distress.    Appearance: He is well-developed.  HENT:     Head: Normocephalic and atraumatic.  Eyes:     Conjunctiva/sclera: Conjunctivae normal.  Cardiovascular:     Rate and Rhythm: Normal rate and regular rhythm.     Heart sounds: No murmur heard. Pulmonary:     Effort: Pulmonary effort is normal. No respiratory distress.     Breath sounds: Normal breath sounds.  Abdominal:     Palpations: Abdomen is soft.     Tenderness: There is no abdominal tenderness.  Musculoskeletal:        General: No swelling.     Cervical back: Neck supple.  Skin:    General: Skin is warm and dry.     Capillary Refill: Capillary refill takes less than 2 seconds.  Neurological:     Mental Status: He is alert.  Psychiatric:        Mood and Affect: Mood normal.     ED Results / Procedures /  Treatments   Labs (all labs ordered are listed, but only abnormal results are displayed) Labs Reviewed  COMPREHENSIVE METABOLIC PANEL - Abnormal; Notable for the following components:      Result Value   Sodium 126 (*)    Chloride 93 (*)    Glucose, Bld 110 (*)    Calcium 8.7 (*)    All other components within normal limits  CBC WITH DIFFERENTIAL/PLATELET - Abnormal; Notable for the following components:   RBC 3.96 (*)    Hemoglobin 11.6 (*)    HCT 35.0 (*)    All other components within normal limits  LIPASE, BLOOD    EKG None  Radiology CT ABDOMEN PELVIS W CONTRAST  Result Date: 08/28/2023 CLINICAL DATA:  Abdominal pain with nausea. Bowel obstruction suspected. EXAM: CT ABDOMEN AND PELVIS WITH CONTRAST TECHNIQUE: Multidetector CT imaging of  the abdomen and pelvis was performed using the standard protocol following bolus administration of intravenous contrast. RADIATION DOSE REDUCTION: This exam was performed according to the departmental dose-optimization program which includes automated exposure control, adjustment of the mA and/or kV according to patient size and/or use of iterative reconstruction technique. CONTRAST:  80mL OMNIPAQUE IOHEXOL 300 MG/ML  SOLN COMPARISON:  07/16/2023. FINDINGS: Lower chest: The heart is enlarged and there is a small pericardial effusion. Coronary artery calcifications are noted. Minimal scarring or atelectasis is present at the lung bases. Hepatobiliary: No focal liver abnormality is seen. No gallstones, gallbladder wall thickening, or biliary dilatation. Pancreas: Mild pancreatic atrophy. No pancreatic ductal dilatation or surrounding inflammatory changes. Spleen: Normal in size without focal abnormality. Adrenals/Urinary Tract: The adrenal glands are within normal limits. The kidneys enhance symmetrically. No renal calculus or hydronephrosis. The bladder is unremarkable. Stomach/Bowel: Stomach is within normal limits. Appendix appears normal. No evidence of bowel wall thickening, distention, or inflammatory changes. No free air or pneumatosis. Vascular/Lymphatic: Aortic atherosclerosis. No enlarged abdominal or pelvic lymph nodes. Reproductive: Prostate gland is enlarged. Other: No abdominopelvic ascites. Musculoskeletal: Fixation hardware is present in the proximal left femur. Degenerative changes are present in the thoracolumbar spine. Old fractures with hardware are noted in the ribs on the left. No acute osseous abnormality is seen. IMPRESSION: 1. No acute intra-abdominal process. 2. Enlarged prostate gland. 3. Cardiomegaly with coronary artery calcifications. 4. Aortic atherosclerosis. Electronically Signed   By: Thornell Sartorius M.D.   On: 08/28/2023 22:40   DG Abdomen 1 View  Result Date: 08/28/2023 CLINICAL  DATA:  Abdominal pain and nausea.  Constipation. EXAM: ABDOMEN - 1 VIEW COMPARISON:  07/16/2023 CT scan FINDINGS: There is a moderate amount of formed stool in the colon. Central abdominal bowel loops which tend to favor small-bowel over large bowel measure up to 5 cm in diameter. This raises the possibility of ileus or obstruction, but is nonspecific. Correlate with bowel sounds and symptoms in determining whether repeat CT abdomen is indicated. Atheromatous vascular disease noted. IMPRESSION: 1. Moderate amount of formed stool in the colon. 2. Central abdominal bowel loops which tend to favor small-bowel over large bowel measure up to 5 cm in diameter. This raises the possibility of ileus or obstruction, but is nonspecific. Correlate with bowel sounds and symptoms in determining whether repeat CT abdomen is indicated. Electronically Signed   By: Gaylyn Rong M.D.   On: 08/28/2023 17:00    Procedures Procedures   Medications Ordered in ED Medications  sodium chloride 0.9 % bolus 1,000 mL (0 mLs Intravenous Stopped 08/28/23 2335)  iohexol (OMNIPAQUE) 300 MG/ML solution 80 mL (80  mLs Intravenous Contrast Given 08/28/23 2219)    ED Course/ Medical Decision Making/ A&P                               Medical Decision Making Amount and/or Complexity of Data Reviewed Labs: ordered. Radiology: ordered.  Risk OTC drugs. Prescription drug management.   This patient presents to the ED for concern of abdominal pain. Differential diagnosis includes constipation, bowel obstruction, pancreatitis, URI   Lab Tests:  I Ordered, and personally interpreted labs.  The pertinent results include: CBC with mild anemia noted, CMP with evident dehydration with hyponatremia 126 and chloride at 93, lipase unremarkable   Imaging Studies ordered:  I ordered imaging studies including abdominal x-ray, CT abdomen pelvis I independently visualized and interpreted imaging which showed x-ray of concern for  possible obstruction versus ileus, CT on pelvis without any acute abnormalities noted on abdomen I agree with the radiologist interpretation   Medicines ordered and prescription drug management:  I ordered medication including fluids for dehydration Reevaluation of the patient after these medicines showed that the patient improved I have reviewed the patients home medicines and have made adjustments as needed   Problem List / ED Course:  Patient presents emergency department concerns of abdominal pain.  Reports that abdominal pain is in center of abdomen without specific radiation.  Endorsing associated nausea without any vomiting and reports last bowel movement approximately 4 days ago.  Also endorsing difficulty with passing gas.  Denies any fever, chest pain, shortness of breath.  Will evaluate with basic labs and imaging at this time.  X-ray of abdomen ordered from triage for evaluation possible obstruction. Labs unremarkable without any acute signs of infectious process present and dehydration noted with sodium of 120-second chloride 93.  Abdominal x-ray concerning for possible ileus versus obstruction.  Will further assess with CT imaging.  No evidence of pancreatitis with lipase at 24. CT imaging negative for any acute abdominal abnormality.  Some enlargement of the prostate noted. Given no evidence of ileus of bowel obstruction on CT imaging, suspect this is largely constipation related. No specific drug-inducing medications noted. Informed patient of reassuring findings and discussed management of constipation at home with MiraLAX, hydration, and fiber intake. Will attempt managing of symptoms outpatient per patient request. Will also send a prescription for senna for patient use at home.  Discussed strict return precautions with patient.  Patient agreeable to treatment plan verbalized understanding strict return precautions.  All questions answered prior to patient discharge.  Patient  discharged home in stable condition.  Final Clinical Impression(s) / ED Diagnoses Final diagnoses:  Generalized abdominal pain  Constipation, unspecified constipation type    Rx / DC Orders ED Discharge Orders          Ordered    senna-docusate (SENOKOT-S) 8.6-50 MG tablet  2 times daily        08/28/23 2310              Smitty Knudsen, PA-C 08/29/23 0002    Franne Forts, DO 08/29/23 [phone]

## 2023-08-28 NOTE — ED Notes (Signed)
Patient stated " I feel hot and have pain." Assessed patients stomach bowel sounds noted and temp 98.1. Notified MD.

## 2023-08-28 NOTE — ED Triage Notes (Signed)
ABD pain Center of ABD Nausea, no vomiting Denies Diarrhea Last BM Tuesday Pt stated he has been unable to pass gas as well

## 2023-08-28 NOTE — Discharge Instructions (Signed)
Hoy lo atendieron en el departamento de emergencias por dolor abdominal.  Sus laboratorios e imgenes no fueron notables para ninguna obstruccin intestinal o ms hallazgos abdominales.  Pareces estar estreido.  Envi una receta de Senokot a su farmacia y le anim a seguir tomando MiraLAX tambin.  Trate de mantenerse hidratado y comer para promover la evacuacin intestinal.  Si no defeca en los prximos das, le sugerira que regrese al departamento de Sports administrator.  You are seen in the emergency department today for abdominal pain.  Your labs and imaging were unremarkable for any bowel obstruction or more concerning abdominal findings.  You do appear to be constipated.  I have sent a prescription for Senokot to your pharmacy and encouraged to continue to take your MiraLAX as well.  Try to stay hydrated and eat to promote a bowel movement.  If you have not a bowel movement in the next several days, I would suggest return to the emergency department.

## 2023-09-09 ENCOUNTER — Ambulatory Visit: Payer: Medicaid Other | Admitting: Gastroenterology

## 2024-03-05 ENCOUNTER — Emergency Department (HOSPITAL_COMMUNITY)
Admission: EM | Admit: 2024-03-05 | Discharge: 2024-03-05 | Disposition: A | Discharge status: Home / Self Care | Attending: Emergency Medicine | Admitting: Emergency Medicine

## 2024-03-05 ENCOUNTER — Other Ambulatory Visit: Payer: Self-pay

## 2024-03-05 ENCOUNTER — Encounter (HOSPITAL_COMMUNITY): Payer: Self-pay

## 2024-03-05 ENCOUNTER — Emergency Department (HOSPITAL_COMMUNITY)

## 2024-03-05 DIAGNOSIS — R531 Weakness: Secondary | ICD-10-CM

## 2024-03-05 DIAGNOSIS — Z79899 Other long term (current) drug therapy: Secondary | ICD-10-CM | POA: Diagnosis not present

## 2024-03-05 DIAGNOSIS — R42 Dizziness and giddiness: Secondary | ICD-10-CM

## 2024-03-05 DIAGNOSIS — R5383 Other fatigue: Secondary | ICD-10-CM | POA: Diagnosis not present

## 2024-03-05 DIAGNOSIS — Z603 Acculturation difficulty: Secondary | ICD-10-CM | POA: Insufficient documentation

## 2024-03-05 DIAGNOSIS — R7309 Other abnormal glucose: Secondary | ICD-10-CM | POA: Insufficient documentation

## 2024-03-05 DIAGNOSIS — Z758 Other problems related to medical facilities and other health care: Secondary | ICD-10-CM

## 2024-03-05 LAB — COMPREHENSIVE METABOLIC PANEL
ALT: 19 U/L (ref 0–44)
AST: 20 U/L (ref 15–41)
Albumin: 3.6 g/dL (ref 3.5–5.0)
Alkaline Phosphatase: 80 U/L (ref 38–126)
Anion gap: 7 (ref 5–15)
BUN: 26 mg/dL — ABNORMAL HIGH (ref 6–20)
CO2: 25 mmol/L (ref 22–32)
Calcium: 8.5 mg/dL — ABNORMAL LOW (ref 8.9–10.3)
Chloride: 101 mmol/L (ref 98–111)
Creatinine, Ser: 1.26 mg/dL — ABNORMAL HIGH (ref 0.61–1.24)
GFR, Estimated: >60 mL/min (ref >60)
Glucose, Bld: 183 mg/dL — ABNORMAL HIGH (ref 70–99)
Potassium: 5 mmol/L (ref 3.5–5.1)
Sodium: 133 mmol/L — ABNORMAL LOW (ref 135–145)
Total Bilirubin: 0.6 mg/dL (ref 0.0–1.2)
Total Protein: 7.2 g/dL (ref 6.5–8.1)

## 2024-03-05 LAB — URINALYSIS, ROUTINE W REFLEX MICROSCOPIC
Bacteria, UA: NONE SEEN
Bilirubin Urine: NEGATIVE
Glucose, UA: NEGATIVE mg/dL
Hgb urine dipstick: NEGATIVE
Ketones, ur: NEGATIVE mg/dL
Nitrite: NEGATIVE
Protein, ur: NEGATIVE mg/dL
Specific Gravity, Urine: 1.014 (ref 1.005–1.030)
pH: 6 (ref 5.0–8.0)

## 2024-03-05 LAB — CBC
HCT: 35.6 % — ABNORMAL LOW (ref 39.0–52.0)
Hemoglobin: 11.7 g/dL — ABNORMAL LOW (ref 13.0–17.0)
MCH: 28.8 pg (ref 26.0–34.0)
MCHC: 32.9 g/dL (ref 30.0–36.0)
MCV: 87.7 fL (ref 80.0–100.0)
Platelets: 192 10*3/uL (ref 150–400)
RBC: 4.06 MIL/uL — ABNORMAL LOW (ref 4.22–5.81)
RDW: 13.1 % (ref 11.5–15.5)
WBC: 8.3 10*3/uL (ref 4.0–10.5)
nRBC: 0 % (ref 0.0–0.2)

## 2024-03-05 LAB — RESP PANEL BY RT-PCR (RSV, FLU A&B, COVID)  RVPGX2
Influenza A by PCR: NEGATIVE
Influenza B by PCR: NEGATIVE
Resp Syncytial Virus by PCR: NEGATIVE
SARS Coronavirus 2 by RT PCR: NEGATIVE

## 2024-03-05 LAB — CBG MONITORING, ED: Glucose-Capillary: 193 mg/dL — ABNORMAL HIGH (ref 70–99)

## 2024-03-05 LAB — TSH: TSH: 4.269 u[IU]/mL (ref 0.350–4.500)

## 2024-03-05 LAB — TROPONIN I (HIGH SENSITIVITY): Troponin I (High Sensitivity): 3 ng/L (ref <18)

## 2024-03-05 MED ORDER — MULTIVITAMINS PO CAPS: 1.0000 | ORAL_CAPSULE | Freq: Every day | ORAL | 0 refills | Status: AC

## 2024-03-05 MED ORDER — SODIUM CHLORIDE 0.9 % IV BOLUS
500.0000 mL | Freq: Once | INTRAVENOUS | Status: AC
  Administered 2024-03-05: 500 mL via INTRAVENOUS

## 2024-03-05 MED ORDER — ONDANSETRON 4 MG PO TBDP: 4.0000 mg | ORAL_TABLET | Freq: Three times a day (TID) | ORAL | 0 refills | Status: AC | PRN

## 2024-03-05 NOTE — ED Notes (Signed)
 Pt only complaint at this time is a headache.

## 2024-03-05 NOTE — ED Notes (Signed)
 Pt has been able to communicate without interpretor as far as getting IV established and getting blood.

## 2024-03-05 NOTE — ED Provider Notes (Signed)
 Pt signed out by Dr. Denton Lank pending MRI brain.  This was reviewed by me.  I agree with the radiologist.  MRI brain: No acute intracranial abnormality.   I also added on a covid/flu swab which was negative.  Pt is able to ambulate with his cane, but is weak.  Pt requested a rx for vitamins.  He also feels diffusely weak and would benefit from PT.  TOC consulted.  Face to face form filled out.  Pt is to f/u with pcp.  He is to return if worse.    Jacalyn Lefevre, MD 03/05/24 1900

## 2024-03-05 NOTE — ED Triage Notes (Signed)
 Pt c/o dizziness starting yesterday with blurry vision with nausea and vomiting when he woke up at 0800. Pt during NIH 1 (pt states feels different sensation in left arm.) EDP at bedside during triage.

## 2024-03-05 NOTE — ED Provider Notes (Signed)
 [place] EMERGENCY DEPARTMENT AT [facility] Provider Note   CSN: [id number] Arrival date & time: 03/05/24  1243     History {Add pertinent medical, surgical, social history, OB history to HPI:1} Chief Complaint  Patient presents with   Dizziness    Timoty Bourke is a 63 y.o. male.  Pt indicates generally hasn't felt well for past month, generally weakness, fatigue, and indicates in past couple of days when walking feels unsteady as if off balance. No room spinning. No syncope or fall. Denies headache. Denies cough, sore throat or uri symptoms. No fever or chills. No hearing loss, tinnitus or ear pain. No neck pain or stiffness. Denies chest pain or discomfort. No sob. No abd pain or nvd. No dysuria or gu c/o. Denies change in meds or new meds. No blood loss, rectal bleeding or melena.  No wt loss. No change in speech or vision. No unilateral numbness or weakness.   The history is provided by the patient and medical records. A language interpreter was used (av interpreter service used).  Dizziness Associated symptoms: no blood in stool, no chest pain, no diarrhea, no headaches, no shortness of breath and no vomiting        Home Medications Prior to Admission medications   Medication Sig Start Date End Date Taking? Authorizing Provider  Cholecalciferol (VITAMIN D3) 50 MCG (2000 UT) CAPS Take 1 capsule by mouth daily. 02/22/24  Yes [provider]  gabapentin (NEURONTIN) 300 MG capsule Take 300 mg by mouth 2 (two) times daily as needed. 02/07/24  Yes [provider]  losartan (COZAAR) 50 MG tablet Take 50 mg by mouth daily. 02/22/24  Yes [provider]  albuterol (VENTOLIN HFA) 108 (90 Base) MCG/ACT inhaler Inhale 2 puffs into the lungs every 6 (six) hours as needed for wheezing or shortness of breath.    [provider]  ferrous sulfate 325 (65 FE) MG tablet Take 1 tablet (325 mg total) by mouth daily with breakfast. Patient not  taking: Reported on 07/29/2023 04/25/23 07/24/23  Vassie Loll, MD  metFORMIN (GLUCOPHAGE) 500 MG tablet Take 1 tablet (500 mg total) by mouth daily with breakfast. 06/07/22 07/29/23  Kendell Bane, MD  Multiple Vitamin (MULTIVITAMIN) tablet Take 1 tablet by mouth daily.    [provider]  pantoprazole (PROTONIX) 20 MG tablet Take 1 tablet (20 mg total) by mouth daily. 07/17/23   Burgess Amor, PA-C  pantoprazole (PROTONIX) 40 MG tablet Take 1 tablet (40 mg total) by mouth daily. 07/29/23   Aida Raider, NP  tamsulosin (FLOMAX) 0.4 MG CAPS capsule Take 1 capsule (0.4 mg total) by mouth daily after breakfast. 06/10/23   Pollina, Canary Brim, MD      Allergies    Patient has no known allergies.    Review of Systems   Review of Systems  Constitutional:  Negative for fever.  HENT:  Negative for sore throat.   Eyes:  Negative for pain and redness.  Respiratory:  Negative for cough and shortness of breath.   Cardiovascular:  Negative for chest pain and leg swelling.  Gastrointestinal:  Negative for abdominal pain, blood in stool, diarrhea and vomiting.  Genitourinary:  Negative for dysuria and flank pain.  Musculoskeletal:  Negative for back pain and neck pain.  Skin:  Negative for rash.  Neurological:  Positive for dizziness. Negative for speech difficulty, numbness and headaches.    Physical Exam Updated Vital Signs BP (!) 107/56 (BP Location: Right Arm)  Pulse 73   Temp (!) 97.5 F (36.4 C)   Resp 18   Ht 1.651 m (5\' 5" )   Wt 47.6 kg   SpO2 100%   BMI 17.46 kg/m  Physical Exam Vitals and nursing note reviewed.  Constitutional:      Appearance: Normal appearance. He is well-developed.  HENT:     Head: Atraumatic.     Comments: No temporal, sinus, or mastoid tenderness.    Right Ear: Tympanic membrane normal.     Left Ear: Tympanic membrane normal.     Nose: Nose normal.     Mouth/Throat:     Mouth: Mucous membranes are moist.     Pharynx: Oropharynx is  clear.  Eyes:     General: No scleral icterus.    Extraocular Movements: Extraocular movements intact.     Conjunctiva/sclera: Conjunctivae normal.     Pupils: Pupils are equal, round, and reactive to light.  Neck:     Vascular: No carotid bruit.     Trachea: No tracheal deviation.     Comments: Trachea midline. Thyroid not grossly enlarged or tender. No bruits. No stiffness or rigidity.  Cardiovascular:     Rate and Rhythm: Normal rate and regular rhythm.     Pulses: Normal pulses.     Heart sounds: Normal heart sounds. No murmur heard.    No friction rub. No gallop.  Pulmonary:     Effort: Pulmonary effort is normal. No accessory muscle usage or respiratory distress.     Breath sounds: Normal breath sounds.  Abdominal:     General: Bowel sounds are normal. There is no distension.     Palpations: Abdomen is soft. There is no mass.     Tenderness: There is no abdominal tenderness. There is no guarding.  Genitourinary:    Comments: No cva tenderness. Musculoskeletal:        General: No swelling or tenderness.     Cervical back: Normal range of motion and neck supple. No rigidity.     Right lower leg: No edema.     Left lower leg: No edema.  Skin:    General: Skin is warm and dry.     Findings: No rash.  Neurological:     Mental Status: He is alert.     Comments: Alert, speech clear. Motor/sens grossly intact bil. No pronator drift. Stre 5/5. Sens grossly intact. Mildly unsteady gait.   Psychiatric:        Mood and Affect: Mood normal.     ED Results / Procedures / Treatments   Labs (all labs ordered are listed, but only abnormal results are displayed) Results for orders placed or performed during the hospital encounter of 03/05/24  CBG monitoring, ED   Collection Time: 03/05/24  1:34 PM  Result Value Ref Range   Glucose-Capillary 193 (H) 70 - 99 mg/dL  Comprehensive metabolic panel   Collection Time: 03/05/24  2:11 PM  Result Value Ref Range   Sodium 133 (L) 135 -  145 mmol/L   Potassium 5.0 3.5 - 5.1 mmol/L   Chloride 101 98 - 111 mmol/L   CO2 25 22 - 32 mmol/L   Glucose, Bld 183 (H) 70 - 99 mg/dL   BUN 26 (H) 6 - 20 mg/dL   Creatinine, Ser 1.61 (H) 0.61 - 1.24 mg/dL   Calcium 8.5 (L) 8.9 - 10.3 mg/dL   Total Protein 7.2 6.5 - 8.1 g/dL   Albumin 3.6 3.5 - 5.0 g/dL   AST 20  15 - 41 U/L   ALT 19 0 - 44 U/L   Alkaline Phosphatase 80 38 - 126 U/L   Total Bilirubin 0.6 0.0 - 1.2 mg/dL   GFR, Estimated >57 >84 mL/min   Anion gap 7 5 - 15  CBC   Collection Time: 03/05/24  2:11 PM  Result Value Ref Range   WBC 8.3 4.0 - 10.5 K/uL   RBC 4.06 (L) 4.22 - 5.81 MIL/uL   Hemoglobin 11.7 (L) 13.0 - 17.0 g/dL   HCT 69.6 (L) 29.5 - 28.4 %   MCV 87.7 80.0 - 100.0 fL   MCH 28.8 26.0 - 34.0 pg   MCHC 32.9 30.0 - 36.0 g/dL   RDW 13.2 44.0 - 10.2 %   Platelets 192 150 - 400 K/uL   nRBC 0.0 0.0 - 0.2 %  TSH   Collection Time: 03/05/24  2:11 PM  Result Value Ref Range   TSH 4.269 0.350 - 4.500 uIU/mL  Troponin I (High Sensitivity)   Collection Time: 03/05/24  2:11 PM  Result Value Ref Range   Troponin I (High Sensitivity) 3 <18 ng/L      EKG EKG Interpretation Date/Time:  Sunday March 05 2024 13:51:48 EDT Ventricular Rate:  68 PR Interval:  212 QRS Duration:  152 QT Interval:  424 QTC Calculation: 450 R Axis:   237  Text Interpretation: Sinus rhythm with 1st degree A-V block Right bundle branch block No significant change was found Confirmed by Cathren Laine ([id number]) on 03/05/2024 2:05:37 PM  Radiology No results found.  Procedures Procedures  {Document cardiac monitor, telemetry assessment procedure when appropriate:1}  Medications Ordered in ED Medications - No data to display  ED Course/ Medical Decision Making/ A&P   {   Click here for ABCD2, HEART and other calculatorsREFRESH Note before signing :1}                              Medical Decision Making Amount and/or Complexity of Data Reviewed Labs: ordered. Radiology:  ordered. ECG/medicine tests: ordered.   Iv ns. Continuous pulse ox and cardiac monitoring. Labs ordered/sent. Imaging ordered.   Differential diagnosis includes cva, ftt, gen weakness, dehydration, etc. Dispo decision including potential need for admission considered - will get labs and imaging and reassess.   Reviewed nursing notes and prior charts for additional history. External reports reviewed.   Cardiac monitor: sinus rhythm, rate 73.  Labs reviewed/interpreted by me - wbc 8, normal. Hct 36. Trop normal. Tsh normal. Chem w mildly high glucose, hco3 normal.   CT reviewed/interpreted by me -   Rec close pcp f/u.  Return precautions provided.    {Document critical care time when appropriate:1} {Document review of labs and clinical decision tools ie heart score, Chads2Vasc2 etc:1}  {Document your independent review of radiology images, and any outside records:1} {Document your discussion with family members, caretakers, and with consultants:1} {Document social determinants of health affecting pt's care:1} {Document your decision making why or why not admission, treatments were needed:1} Final Clinical Impression(s) / ED Diagnoses Final diagnoses:  None    Rx / DC Orders ED Discharge Orders     None

## 2024-03-08 IMAGING — CT CT CHEST W/O CM
2 of 4 series · 15 of 36 positions shown, 18 images · non-contrast
Comparison: None Available.

CLINICAL DATA: Chest pain and shortness of breath.



[Series 2: routine chest without · axial · non-contrast · 0.71mm/px · z∈[-184,+48]mm · 12 of 138 slices shown, 15 images]
[im 11/138  mediastinal]
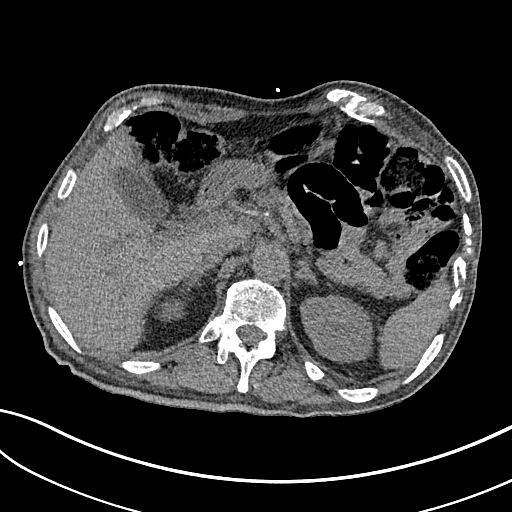
[im 11/138  lung]
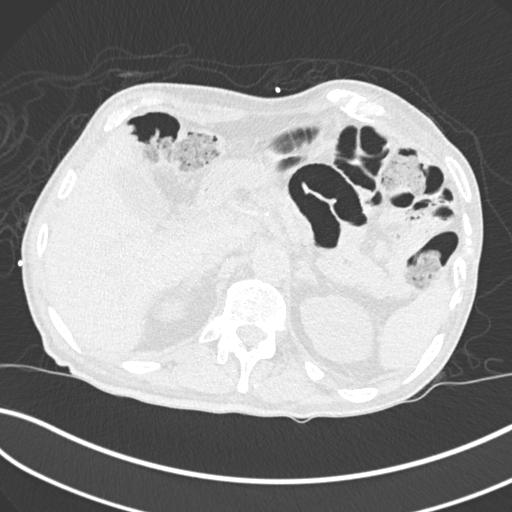
[im 22/138  lung]
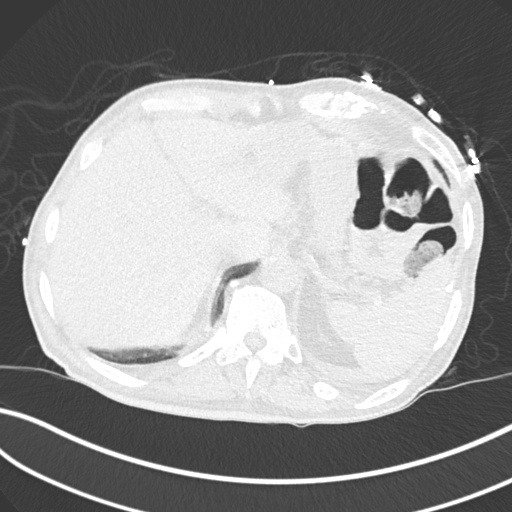
[im 32/138  lung]
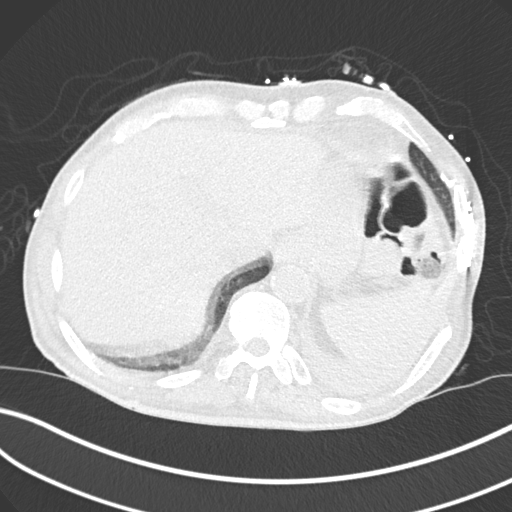
[im 43/138  lung]
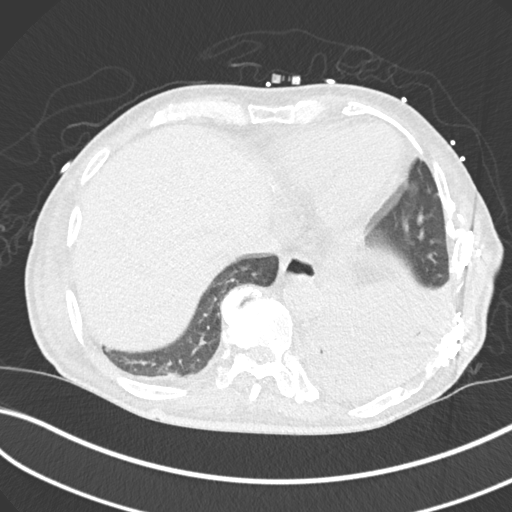
[im 53/138  mediastinal]
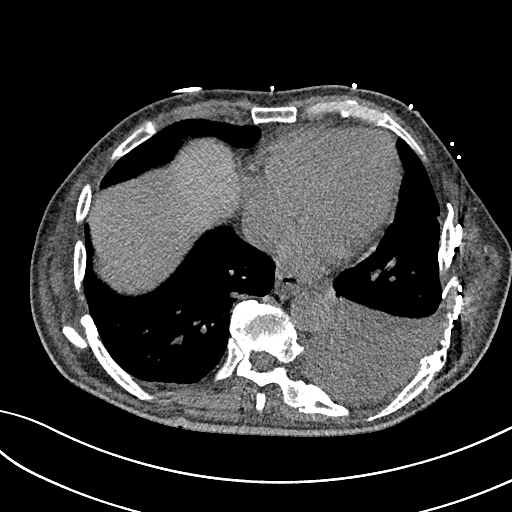
[im 53/138  lung]
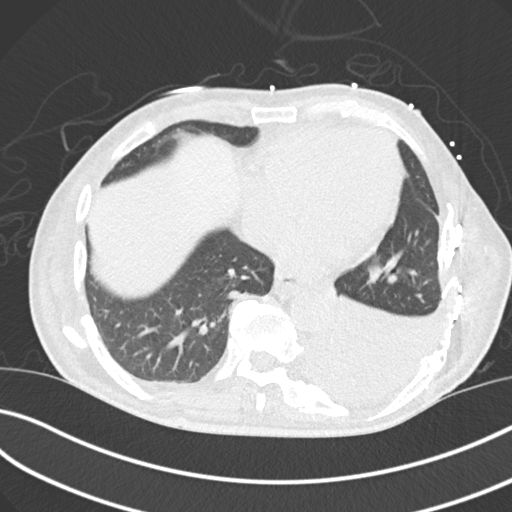
[im 64/138  lung]
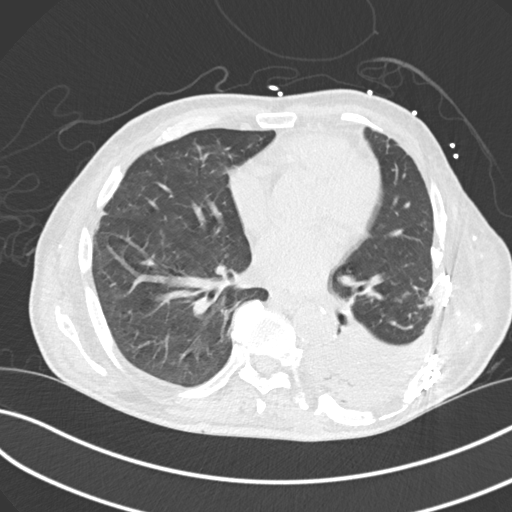
[im 74/138  lung]
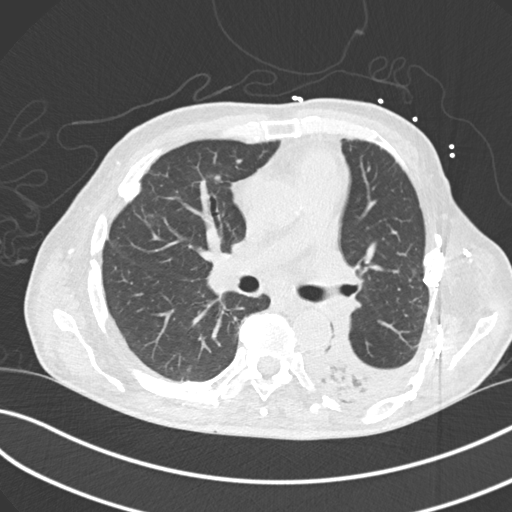
[im 85/138  lung]
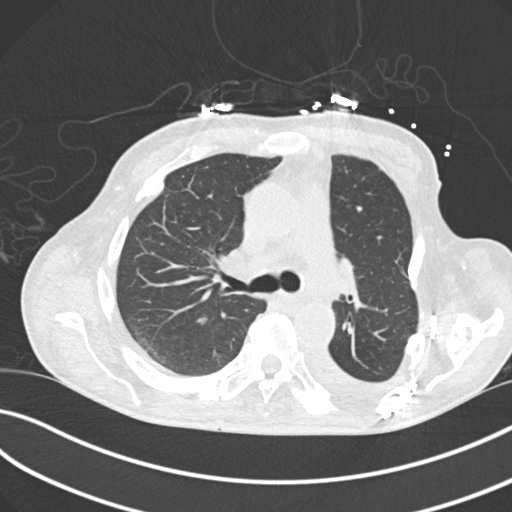
[im 95/138  mediastinal]
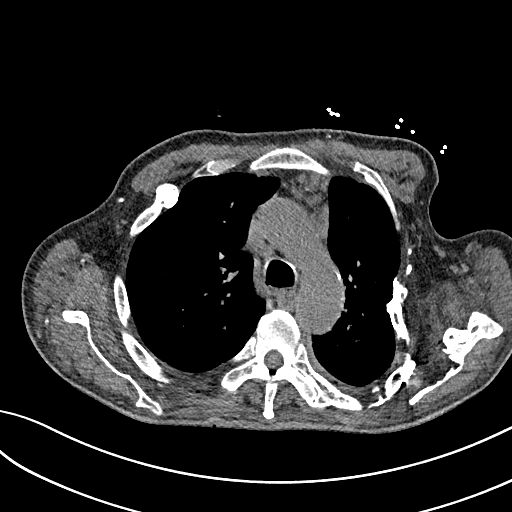
[im 95/138  lung]
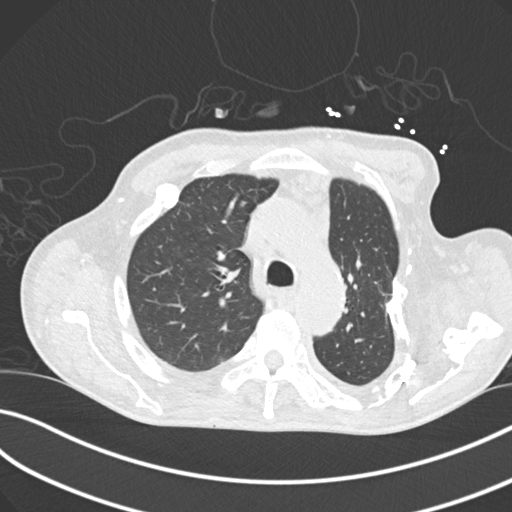
[im 106/138  lung]
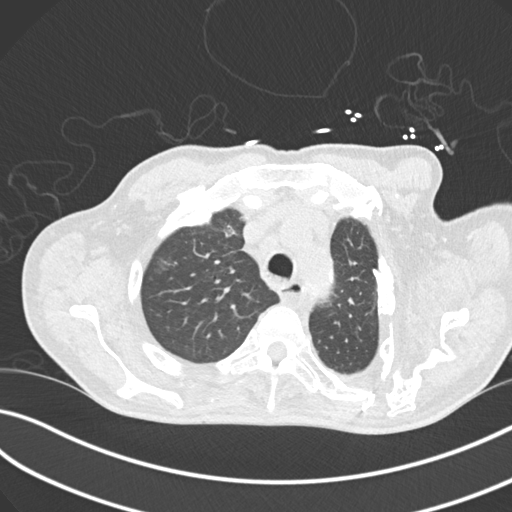
[im 116/138  lung]
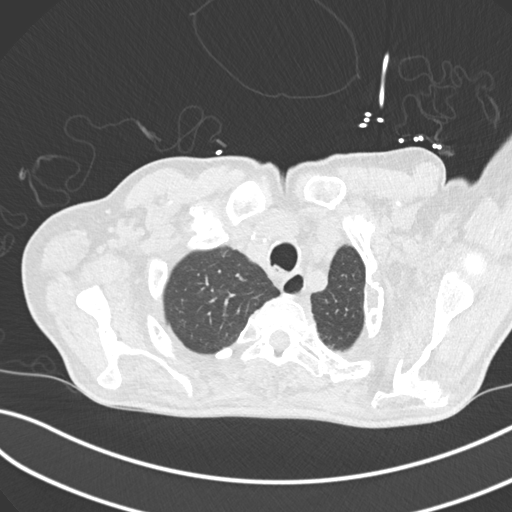
[im 127/138  lung]
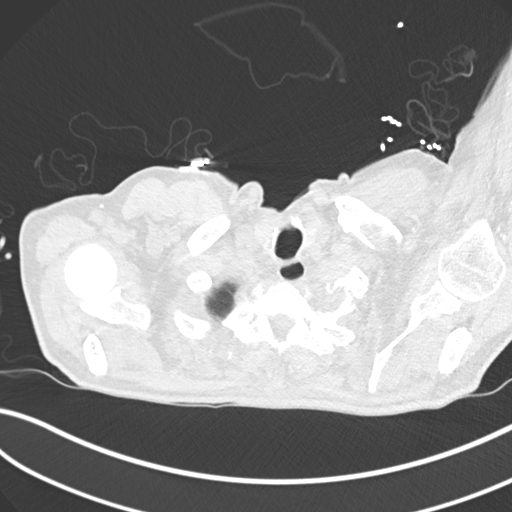

[Series 5: coronal · coronal · 0.57mm/px · 3 of 148 slices shown]
[im 30/148  lung]
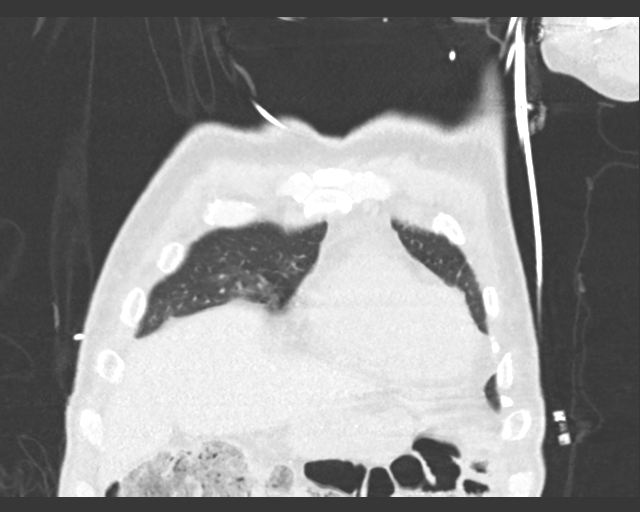
[im 59/148  lung]
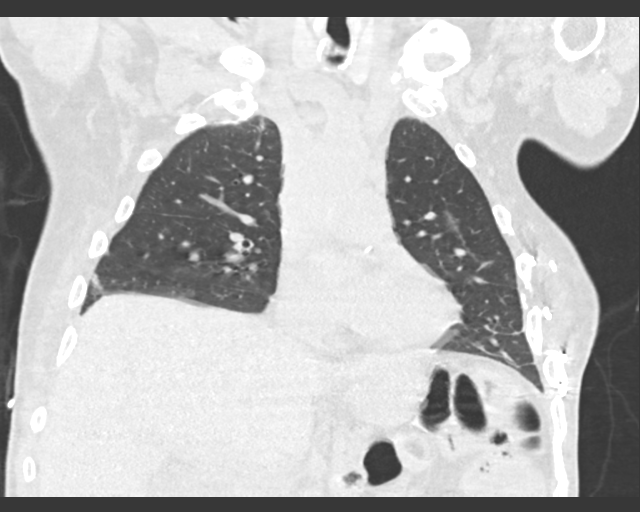
[im 89/148  lung]
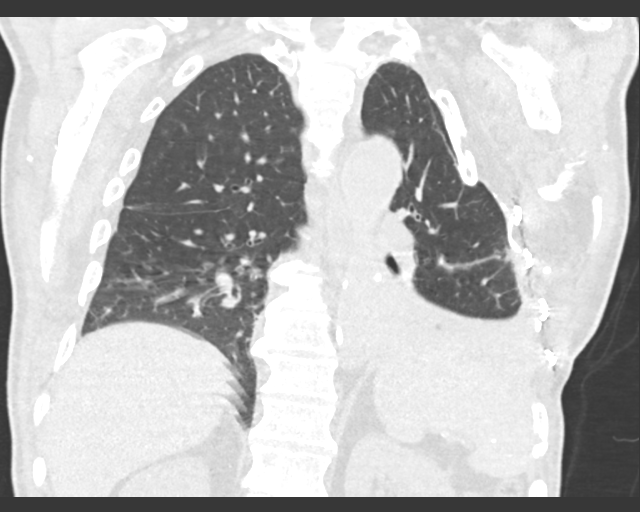

[15 of 36 positions shown; findings below may reference images not displayed]

FINDINGS: Cardiovascular: There is mild calcification of the aortic arch,
without evidence of aortic aneurysm. Normal heart size with moderate
to marked severity coronary artery calcification. No pericardial
effusion.

Mediastinum/Nodes: There is mild AP window and pretracheal
lymphadenopathy. Thyroid gland, trachea, and esophagus demonstrate
no significant findings.

Lungs/Pleura: Chronic left-sided volume loss is seen with marked
severity consolidation noted within a partially collapsed left lower
lobe.

A small left pleural effusion is suspected.

No pneumothorax is identified.

Upper Abdomen: No acute abnormality.

Musculoskeletal: There is a chronic fracture of the left scapula and
mid left clavicle.

Extensive chronic and postoperative changes are seen involving
numerous left ribs, with multiple lateral metallic density fusion
plates and screws seen. Multiple chronic right rib deformities are
also present.

Mild degenerative changes are seen within the thoracic spine.
IMPRESSION: 1. Marked severity consolidation within a partially collapsed left
lower lobe. Follow-up to resolution is recommended, as an underlying
neoplastic process cannot be excluded.
2. Small left pleural effusion.
3. Moderate to marked severity coronary artery disease.
4. Chronic fracture of the left scapula and mid left clavicle.
5. Extensive chronic and postoperative changes involving numerous
left ribs, with multiple chronic right rib deformities.
6. Aortic atherosclerosis.

Aortic Atherosclerosis ([id number]).

## 2024-03-08 IMAGING — DX DG CHEST 1V PORT
1 series · 1 of 1 positions shown · non-contrast
Comparison: August 20, 2005.
COMPARISON: August 20, 2005.

Addendum:
CLINICAL DATA: Shortness of breath, chest pain.

EXAM:
PORTABLE CHEST 1 VIEW

[chest ap]
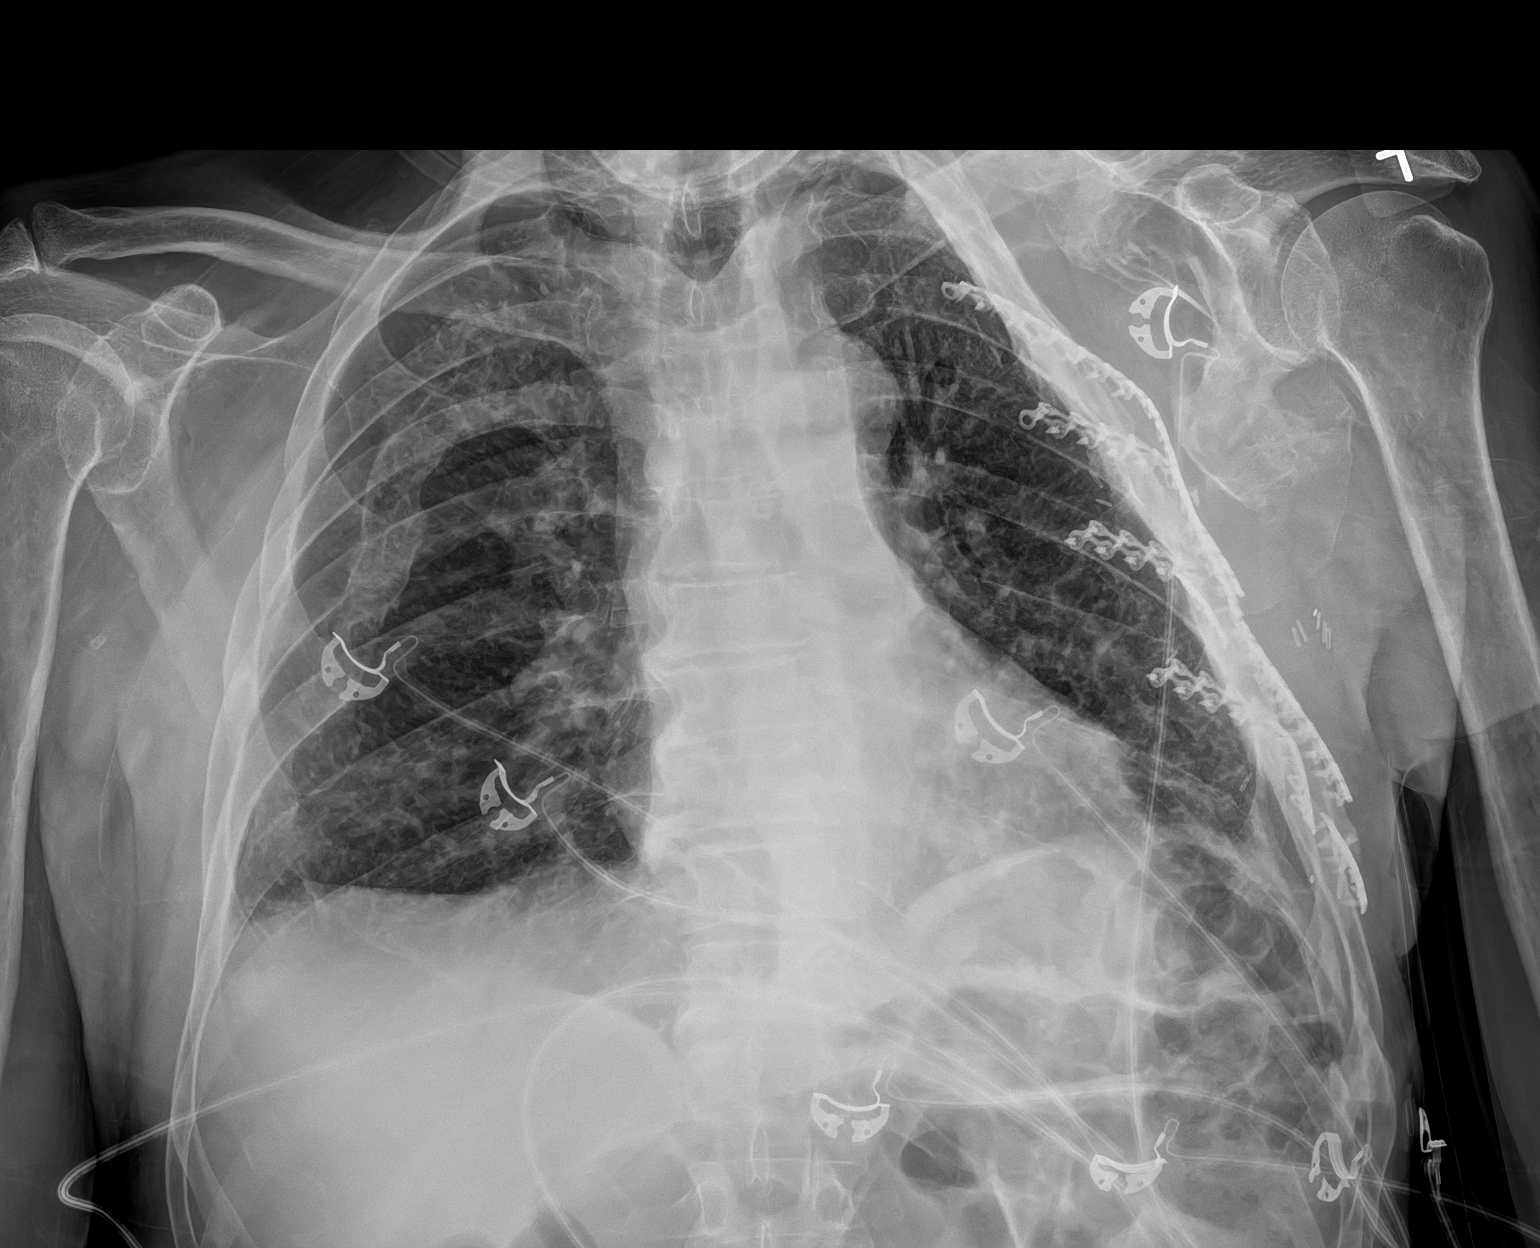

[1 of 1 positions shown; findings below may reference images not displayed]

FINDINGS: The heart size and mediastinal contours are within normal limits.
Both lungs are clear. Status post surgical fixation of multiple old
left rib fractures. Old right rib fractures are noted.
IMPRESSION: No acute cardiopulmonary abnormality seen.

ADDENDUM:
There is noted curvilinear density in the right lung apex; is
uncertain if this represents external artifact or a possible
pneumothorax, although there is the suggestion of lung markings
beyond it. CT scan [DATE] be performed for further evaluation.
These results were discussed by telephone at the time of
interpretation on 06/03/2022 at [DATE] with provider YASIT REALES ,
who verbally acknowledged these results.

*** End of Addendum ***
FINDINGS: The heart size and mediastinal contours are within normal limits.
Both lungs are clear. Status post surgical fixation of multiple old
left rib fractures. Old right rib fractures are noted.
IMPRESSION: No acute cardiopulmonary abnormality seen.

## 2024-03-09 IMAGING — CT CT ANGIO CHEST
2 of 8 series · 18 of 46 positions shown · IV contrast (agent unspecified)
Comparison: CT 1 day prior

CLINICAL DATA: Pulmonary embolism suspected

EXAM:
CT ANGIOGRAPHY CHEST WITH CONTRAST
TECHNIQUE: Multidetector CT imaging of the chest was performed using the
standard protocol during bolus administration of intravenous
contrast. Multiplanar CT image reconstructions and MIPs were
obtained to evaluate the vascular anatomy.

[Series 5: pe axial thins · axial · 0.71mm/px · z∈[+1058,+1295]mm · 15 of 334 slices shown]
[im 19/334  lung]
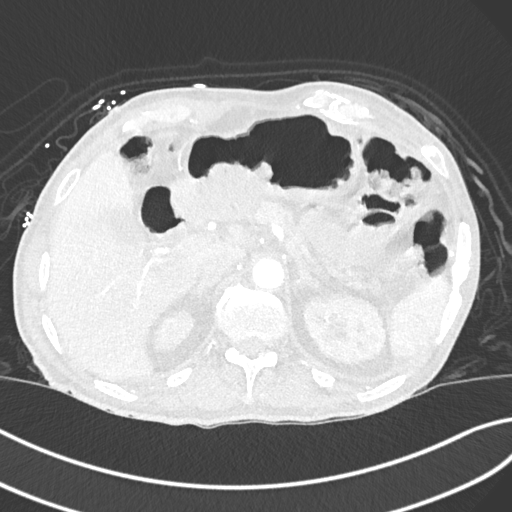
[im 38/334  soft-tissue]
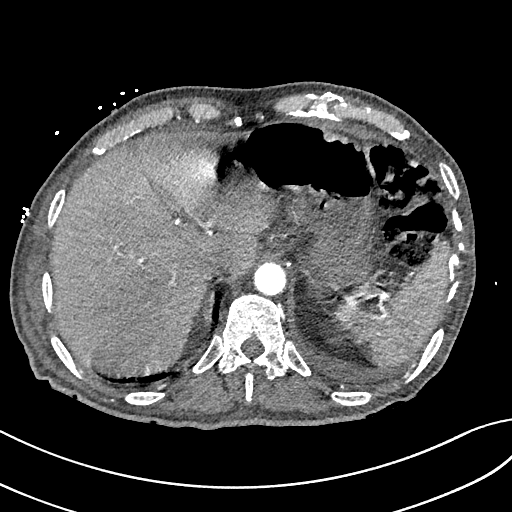
[im 56/334  lung]
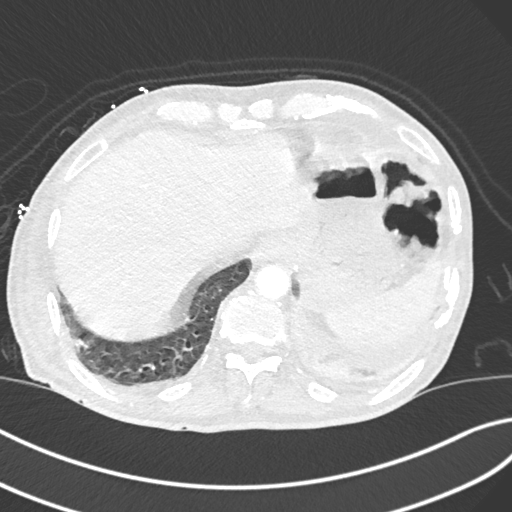
[im 75/334  soft-tissue]
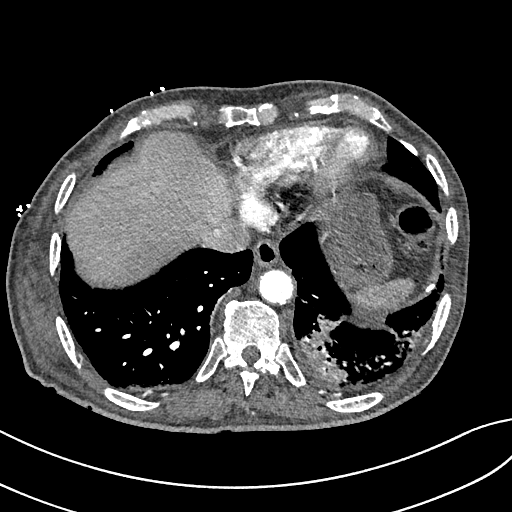
[im 112/334  lung]
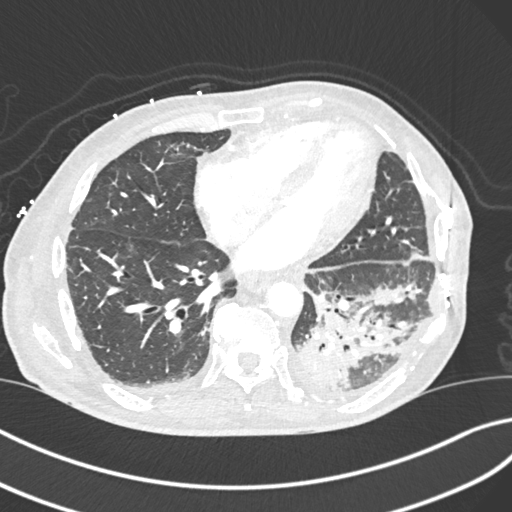
[im 130/334  soft-tissue]
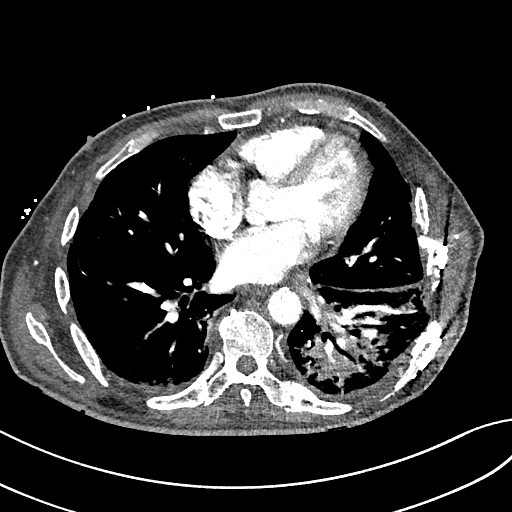
[im 149/334  lung]
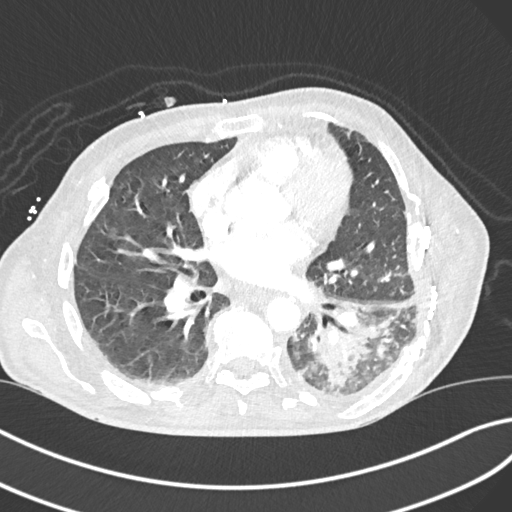
[im 167/334  soft-tissue]
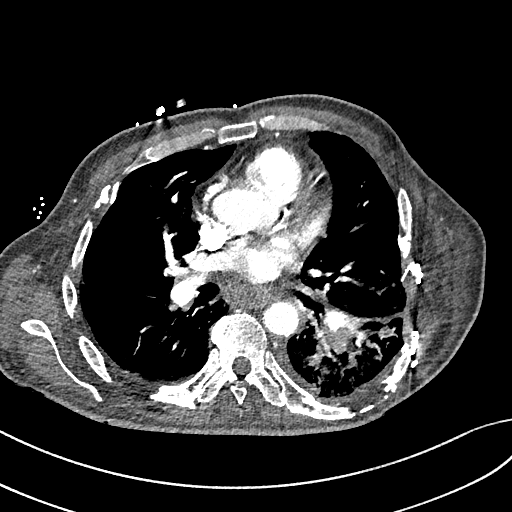
[im 186/334  lung]
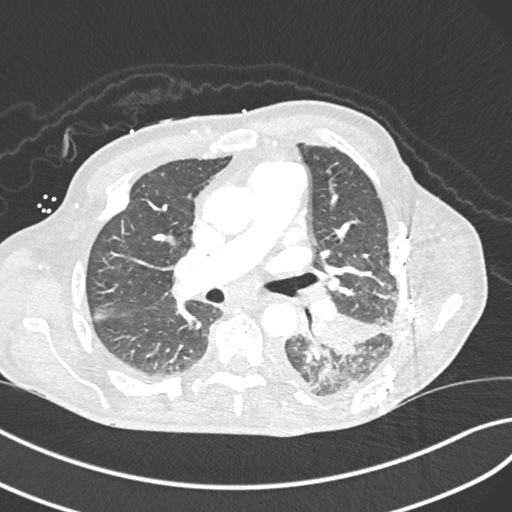
[im 204/334  soft-tissue]
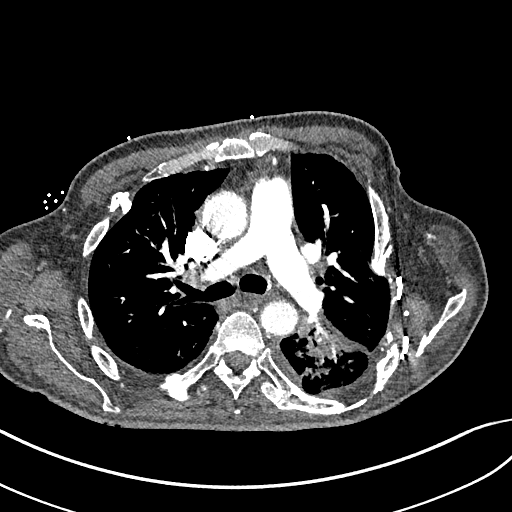
[im 223/334  lung]
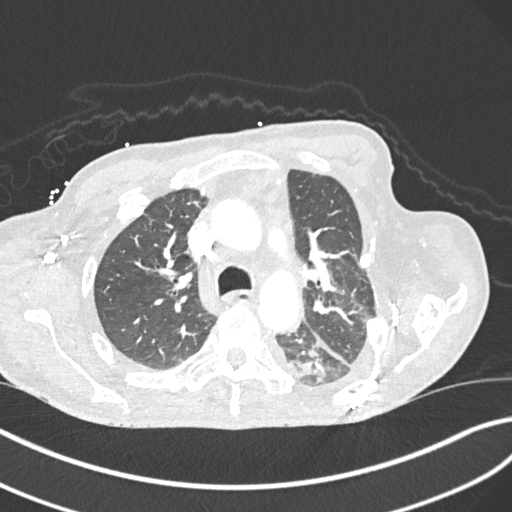
[im 260/334  soft-tissue]
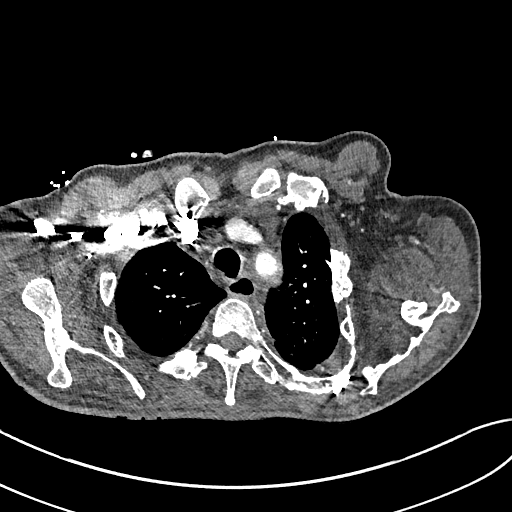
[im 278/334  lung]
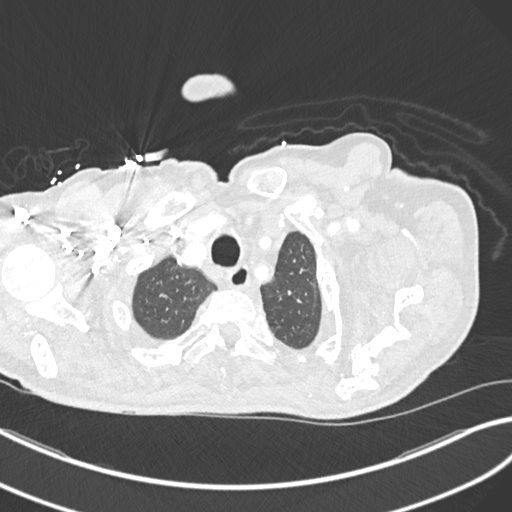
[im 297/334  soft-tissue]
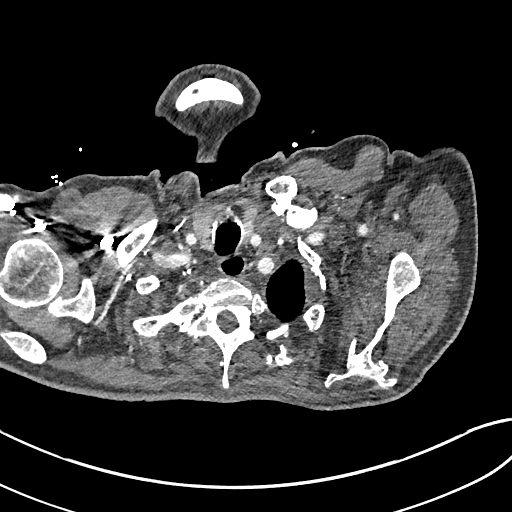
[im 315/334  lung]
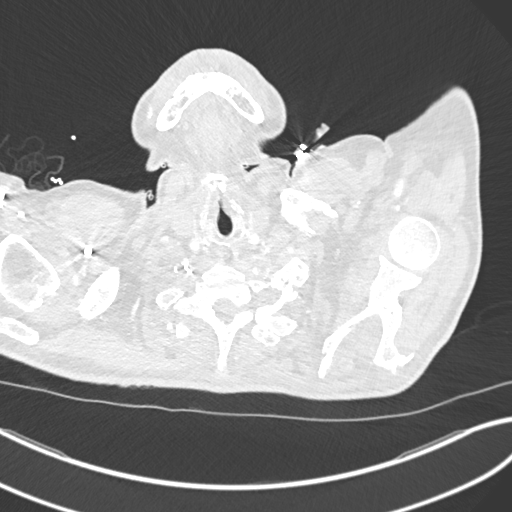

[Series 7: cor soft · coronal · 0.58mm/px · 3 of 151 slices shown]
[im 38/151  soft-tissue]
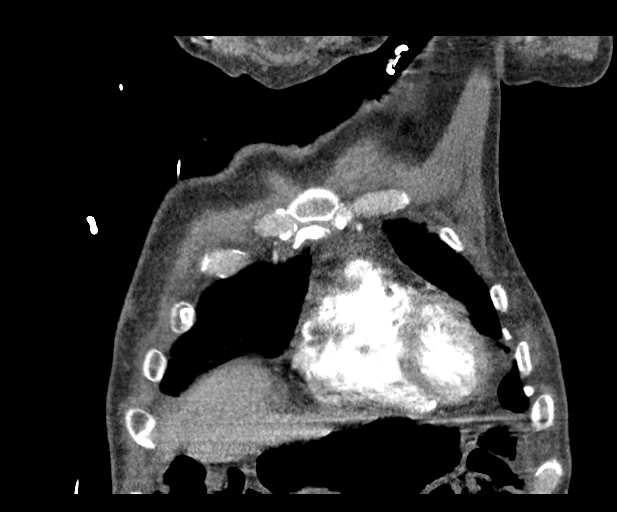
[im 76/151  soft-tissue]
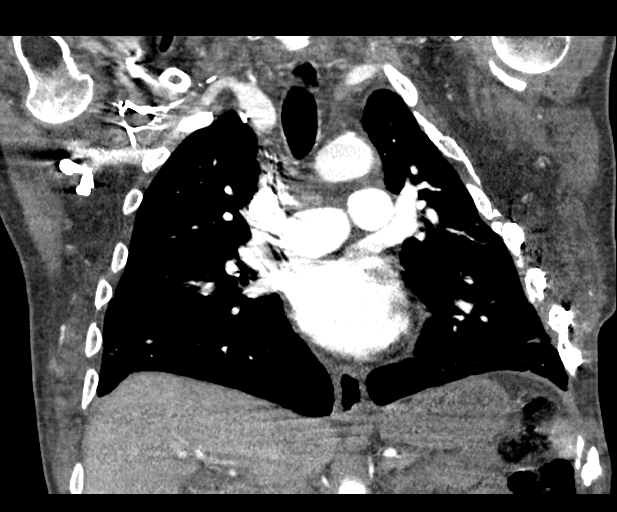
[im 113/151  soft-tissue]
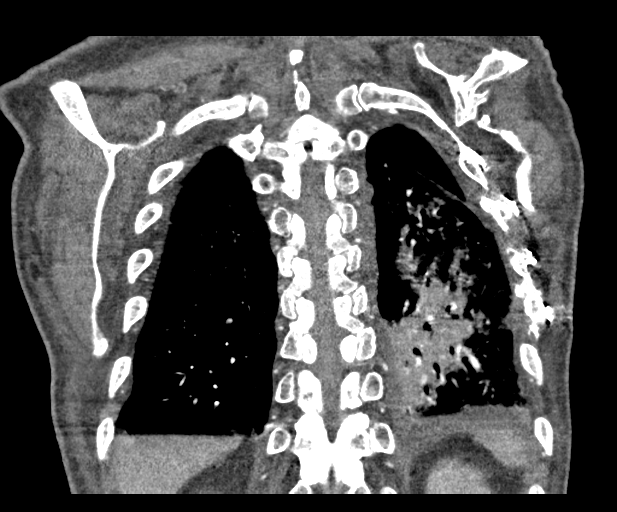

[18 of 46 positions shown; findings below may reference images not displayed]

RADIATION DOSE REDUCTION: This exam was performed according to the
departmental dose-optimization program which includes automated
exposure control, adjustment of the mA and/or kV according to
patient size and/or use of iterative reconstruction technique.

CONTRAST:  75mL OMNIPAQUE IOHEXOL 350 MG/ML SOLN
FINDINGS: Cardiovascular: No filling defects within the pulmonary arteries to
suggest acute pulmonary embolism.

Mediastinum/Nodes: Several mildly enlarged lymph nodes are again
noted. Subcarinal node measures 20 mm. Prevascular node measures 12
mm.

Lungs/Pleura: There is improvement in the consolidation within the
LEFT lower lobe from 1 day prior. Consolidation with air
bronchograms remains.

No pneumothorax.

Upper Abdomen: Limited view of the liver, kidneys, pancreas are
unremarkable. Normal adrenal glands.

Musculoskeletal: Multiple plate fixation of LEFT lateral rib
fractures. Deformity of the chest wall so shaded with the prior
trauma.

Review of the MIP images confirms the above findings.
IMPRESSION: 1. No evidence acute pulmonary embolism.
2. Improved aeration to the LEFT lower lobe.
3. Consolidation in the LEFT lower lobe most consistent with
bronchopneumonia.
4. Mediastinal adenopathy is likely reactive and related to LEFT
lobe pneumonia.
5. Recommend follow-up CT to ensure resolution of the LEFT lobe
process and evaluate adenopathy.

## 2024-04-13 ENCOUNTER — Encounter: Payer: Self-pay | Admitting: *Deleted

## 2024-08-06 ENCOUNTER — Other Ambulatory Visit: Payer: Self-pay

## 2024-08-06 ENCOUNTER — Encounter (HOSPITAL_COMMUNITY): Payer: Self-pay | Admitting: Emergency Medicine

## 2024-08-06 ENCOUNTER — Emergency Department (HOSPITAL_COMMUNITY)

## 2024-08-06 ENCOUNTER — Inpatient Hospital Stay (HOSPITAL_COMMUNITY)
Admission: EM | Admit: 2024-08-06 | Discharge: 2024-08-09 | DRG: 871 | Disposition: A | Discharge status: Home / Self Care | Attending: Family Medicine | Admitting: Family Medicine

## 2024-08-06 DIAGNOSIS — Z603 Acculturation difficulty: Secondary | ICD-10-CM | POA: Diagnosis present

## 2024-08-06 DIAGNOSIS — Z942 Lung transplant status: Secondary | ICD-10-CM

## 2024-08-06 DIAGNOSIS — Z681 Body mass index (BMI) 19 or less, adult: Secondary | ICD-10-CM

## 2024-08-06 DIAGNOSIS — Z7984 Long term (current) use of oral hypoglycemic drugs: Secondary | ICD-10-CM

## 2024-08-06 DIAGNOSIS — E1165 Type 2 diabetes mellitus with hyperglycemia: Secondary | ICD-10-CM | POA: Diagnosis present

## 2024-08-06 DIAGNOSIS — E440 Moderate protein-calorie malnutrition: Secondary | ICD-10-CM | POA: Diagnosis present

## 2024-08-06 DIAGNOSIS — J189 Pneumonia, unspecified organism: Secondary | ICD-10-CM | POA: Diagnosis present

## 2024-08-06 DIAGNOSIS — Z1152 Encounter for screening for COVID-19: Secondary | ICD-10-CM

## 2024-08-06 DIAGNOSIS — Z79899 Other long term (current) drug therapy: Secondary | ICD-10-CM

## 2024-08-06 DIAGNOSIS — A419 Sepsis, unspecified organism: Principal | ICD-10-CM | POA: Diagnosis present

## 2024-08-06 DIAGNOSIS — I10 Essential (primary) hypertension: Secondary | ICD-10-CM | POA: Diagnosis present

## 2024-08-06 DIAGNOSIS — R636 Underweight: Secondary | ICD-10-CM

## 2024-08-06 DIAGNOSIS — Z87440 Personal history of urinary (tract) infections: Secondary | ICD-10-CM

## 2024-08-06 DIAGNOSIS — K219 Gastro-esophageal reflux disease without esophagitis: Secondary | ICD-10-CM | POA: Diagnosis present

## 2024-08-06 LAB — COMPREHENSIVE METABOLIC PANEL WITH GFR
ALT: 21 U/L (ref 0–44)
AST: 22 U/L (ref 15–41)
Albumin: 4.1 g/dL (ref 3.5–5.0)
Alkaline Phosphatase: 86 U/L (ref 38–126)
Anion gap: 12 (ref 5–15)
BUN: 22 mg/dL (ref 8–23)
CO2: 24 mmol/L (ref 22–32)
Calcium: 9.1 mg/dL (ref 8.9–10.3)
Chloride: 99 mmol/L (ref 98–111)
Creatinine, Ser: 0.72 mg/dL (ref 0.61–1.24)
GFR, Estimated: >60 mL/min (ref >60)
Glucose, Bld: 141 mg/dL — ABNORMAL HIGH (ref 70–99)
Potassium: 4.4 mmol/L (ref 3.5–5.1)
Sodium: 135 mmol/L (ref 135–145)
Total Bilirubin: 0.6 mg/dL (ref 0.0–1.2)
Total Protein: 7.8 g/dL (ref 6.5–8.1)

## 2024-08-06 LAB — RAPID URINE DRUG SCREEN, HOSP PERFORMED
Amphetamines: NOT DETECTED
Barbiturates: NOT DETECTED
Benzodiazepines: NOT DETECTED
Cocaine: NOT DETECTED
Opiates: NOT DETECTED
Tetrahydrocannabinol: NOT DETECTED

## 2024-08-06 LAB — CBC WITH DIFFERENTIAL/PLATELET
Abs Immature Granulocytes: 0.04 K/uL (ref 0.00–0.07)
Basophils Absolute: 0.1 K/uL (ref 0.0–0.1)
Basophils Relative: 0 %
Eosinophils Absolute: 0.3 K/uL (ref 0.0–0.5)
Eosinophils Relative: 2 %
HCT: 37.8 % — ABNORMAL LOW (ref 39.0–52.0)
Hemoglobin: 13 g/dL (ref 13.0–17.0)
Immature Granulocytes: 0 %
Lymphocytes Relative: 13 %
Lymphs Abs: 1.8 K/uL (ref 0.7–4.0)
MCH: 30 pg (ref 26.0–34.0)
MCHC: 34.4 g/dL (ref 30.0–36.0)
MCV: 87.1 fL (ref 80.0–100.0)
Monocytes Absolute: 0.5 K/uL (ref 0.1–1.0)
Monocytes Relative: 4 %
Neutro Abs: 11.1 K/uL — ABNORMAL HIGH (ref 1.7–7.7)
Neutrophils Relative %: 81 %
Platelets: 213 K/uL (ref 150–400)
RBC: 4.34 MIL/uL (ref 4.22–5.81)
RDW: 12.8 % (ref 11.5–15.5)
WBC: 13.7 K/uL — ABNORMAL HIGH (ref 4.0–10.5)
nRBC: 0 % (ref 0.0–0.2)

## 2024-08-06 LAB — URINALYSIS, ROUTINE W REFLEX MICROSCOPIC
Bacteria, UA: NONE SEEN
Bilirubin Urine: NEGATIVE
Glucose, UA: NEGATIVE mg/dL
Ketones, ur: NEGATIVE mg/dL
Leukocytes,Ua: NEGATIVE
Nitrite: NEGATIVE
Protein, ur: 30 mg/dL — AB
Specific Gravity, Urine: 1.008 (ref 1.005–1.030)
pH: 8 (ref 5.0–8.0)

## 2024-08-06 LAB — LACTIC ACID, PLASMA: Lactic Acid, Venous: 1.1 mmol/L (ref 0.5–1.9)

## 2024-08-06 LAB — ETHANOL: Alcohol, Ethyl (B): <15 mg/dL (ref <15)

## 2024-08-06 NOTE — ED Triage Notes (Signed)
 Unsure of pt complaint. Pt in hospital provided wheelchair with blanket covering him from head to toe. Pt just moans. When asking pt what we are seeing him for, pt states cold. When attempting to change pt into a gown and get VS, pt refuses gown and pulls blanket back over him and curls into a fetal position.

## 2024-08-06 NOTE — ED Notes (Signed)
 Report given to Ginger RN  of [facility] at this time. No questions, comments, or concerns after report was given.

## 2024-08-06 NOTE — ED Provider Notes (Signed)
 11:13 PM Assumed care from Dr. Albertina, please see their note for full history, physical and decision making until this point. In brief this is a 63 y.o. year old male who presented to the ED tonight with Chills     Feels cold. Pending cxr, covid, reevaluation for disposition.   Patient with a persistent tachycardia so I asked for a rectal temperature which was elevated at 103.  Patient was given Tylenol  and code sepsis was initiated for unknown source.  His workup was overall reassuring so CT scan done to evaluate for any occult infection and looks like possible pneumonia in the right middle lobe.  Cultures already drawn.  Antibiotics already been started.  Patient then became somewhat hypotensive which improved with fluid resuscitation.  Lactic acid was within normal limits.  I suspect this is mostly hypovolemia rather than vasodilatory shock related to sepsis.  Discussed with hospitalist for admission.  CRITICAL CARE Performed by: Selinda Sias Total critical care time: 35 minutes Critical care time was exclusive of separately billable procedures and treating other patients. Critical care was necessary to treat or prevent imminent or life-threatening deterioration. Critical care was time spent personally by me on the following activities: development of treatment plan with patient and/or surrogate as well as nursing, discussions with consultants, evaluation of patient's response to treatment, examination of patient, obtaining history from patient or surrogate, ordering and performing treatments and interventions, ordering and review of laboratory studies, ordering and review of radiographic studies, pulse oximetry and re-evaluation of patient's condition.    Labs, studies and imaging reviewed by myself and considered in medical decision making if ordered. Imaging interpreted by radiology.  Labs Reviewed  COMPREHENSIVE METABOLIC PANEL WITH GFR - Abnormal; Notable for the following components:       Result Value   Glucose, Bld 141 (*)    All other components within normal limits  CBC WITH DIFFERENTIAL/PLATELET - Abnormal; Notable for the following components:   WBC 13.7 (*)    HCT 37.8 (*)    Neutro Abs 11.1 (*)    All other components within normal limits  URINALYSIS, ROUTINE W REFLEX MICROSCOPIC - Abnormal; Notable for the following components:   Color, Urine STRAW (*)    Hgb urine dipstick SMALL (*)    Protein, ur 30 (*)    All other components within normal limits  RESP PANEL BY RT-PCR (RSV, FLU A&B, COVID)  [id number]  LACTIC ACID, PLASMA  ETHANOL  RAPID URINE DRUG SCREEN, HOSP PERFORMED  LACTIC ACID, PLASMA    DG Chest Portable 1 View    (Results Pending)    No follow-ups on file.    Kieth Hartis, Selinda, MD 08/07/24 0430

## 2024-08-07 ENCOUNTER — Emergency Department (HOSPITAL_COMMUNITY)

## 2024-08-07 ENCOUNTER — Other Ambulatory Visit: Payer: Self-pay

## 2024-08-07 DIAGNOSIS — J189 Pneumonia, unspecified organism: Secondary | ICD-10-CM

## 2024-08-07 DIAGNOSIS — R636 Underweight: Secondary | ICD-10-CM

## 2024-08-07 DIAGNOSIS — Z681 Body mass index (BMI) 19 or less, adult: Secondary | ICD-10-CM

## 2024-08-07 DIAGNOSIS — Z942 Lung transplant status: Secondary | ICD-10-CM | POA: Diagnosis not present

## 2024-08-07 DIAGNOSIS — I10 Essential (primary) hypertension: Secondary | ICD-10-CM | POA: Diagnosis present

## 2024-08-07 DIAGNOSIS — K219 Gastro-esophageal reflux disease without esophagitis: Secondary | ICD-10-CM | POA: Diagnosis present

## 2024-08-07 DIAGNOSIS — A419 Sepsis, unspecified organism: Secondary | ICD-10-CM | POA: Diagnosis present

## 2024-08-07 DIAGNOSIS — Z603 Acculturation difficulty: Secondary | ICD-10-CM | POA: Diagnosis present

## 2024-08-07 DIAGNOSIS — Z79899 Other long term (current) drug therapy: Secondary | ICD-10-CM | POA: Diagnosis not present

## 2024-08-07 DIAGNOSIS — E1165 Type 2 diabetes mellitus with hyperglycemia: Secondary | ICD-10-CM | POA: Diagnosis present

## 2024-08-07 DIAGNOSIS — Z87440 Personal history of urinary (tract) infections: Secondary | ICD-10-CM | POA: Diagnosis not present

## 2024-08-07 DIAGNOSIS — E440 Moderate protein-calorie malnutrition: Secondary | ICD-10-CM | POA: Diagnosis present

## 2024-08-07 DIAGNOSIS — Z1152 Encounter for screening for COVID-19: Secondary | ICD-10-CM | POA: Diagnosis not present

## 2024-08-07 DIAGNOSIS — Z7984 Long term (current) use of oral hypoglycemic drugs: Secondary | ICD-10-CM | POA: Diagnosis not present

## 2024-08-07 LAB — CBC
HCT: 33.7 % — ABNORMAL LOW (ref 39.0–52.0)
Hemoglobin: 11.3 g/dL — ABNORMAL LOW (ref 13.0–17.0)
MCH: 29.6 pg (ref 26.0–34.0)
MCHC: 33.5 g/dL (ref 30.0–36.0)
MCV: 88.2 fL (ref 80.0–100.0)
Platelets: 205 K/uL (ref 150–400)
RBC: 3.82 MIL/uL — ABNORMAL LOW (ref 4.22–5.81)
RDW: 12.9 % (ref 11.5–15.5)
WBC: 13.8 K/uL — ABNORMAL HIGH (ref 4.0–10.5)
nRBC: 0 % (ref 0.0–0.2)

## 2024-08-07 LAB — MAGNESIUM: Magnesium: 1.6 mg/dL — ABNORMAL LOW (ref 1.7–2.4)

## 2024-08-07 LAB — RESP PANEL BY RT-PCR (RSV, FLU A&B, COVID)  RVPGX2
Influenza A by PCR: NEGATIVE
Influenza B by PCR: NEGATIVE
Resp Syncytial Virus by PCR: NEGATIVE
SARS Coronavirus 2 by RT PCR: NEGATIVE

## 2024-08-07 LAB — COMPREHENSIVE METABOLIC PANEL WITH GFR
ALT: 16 U/L (ref 0–44)
AST: 19 U/L (ref 15–41)
Albumin: 2.9 g/dL — ABNORMAL LOW (ref 3.5–5.0)
Alkaline Phosphatase: 63 U/L (ref 38–126)
Anion gap: 5 (ref 5–15)
BUN: 18 mg/dL (ref 8–23)
CO2: 24 mmol/L (ref 22–32)
Calcium: 8 mg/dL — ABNORMAL LOW (ref 8.9–10.3)
Chloride: 104 mmol/L (ref 98–111)
Creatinine, Ser: 0.71 mg/dL (ref 0.61–1.24)
GFR, Estimated: >60 mL/min (ref >60)
Glucose, Bld: 137 mg/dL — ABNORMAL HIGH (ref 70–99)
Potassium: 3.9 mmol/L (ref 3.5–5.1)
Sodium: 133 mmol/L — ABNORMAL LOW (ref 135–145)
Total Bilirubin: 0.7 mg/dL (ref 0.0–1.2)
Total Protein: 5.7 g/dL — ABNORMAL LOW (ref 6.5–8.1)

## 2024-08-07 LAB — PROCALCITONIN: Procalcitonin: 0.26 ng/mL

## 2024-08-07 LAB — LACTIC ACID, PLASMA: Lactic Acid, Venous: 0.9 mmol/L (ref 0.5–1.9)

## 2024-08-07 LAB — GLUCOSE, CAPILLARY
Glucose-Capillary: 130 mg/dL — ABNORMAL HIGH (ref 70–99)
Glucose-Capillary: 101 mg/dL — ABNORMAL HIGH (ref 70–99)
Glucose-Capillary: 149 mg/dL — ABNORMAL HIGH (ref 70–99)
Glucose-Capillary: 188 mg/dL — ABNORMAL HIGH (ref 70–99)

## 2024-08-07 LAB — HEMOGLOBIN A1C
Hgb A1c MFr Bld: 6.9 % — ABNORMAL HIGH (ref 4.8–5.6)
Mean Plasma Glucose: 151.33 mg/dL

## 2024-08-07 LAB — TROPONIN I (HIGH SENSITIVITY): Troponin I (High Sensitivity): 9 ng/L (ref <18)

## 2024-08-07 LAB — PHOSPHORUS: Phosphorus: 3.2 mg/dL (ref 2.5–4.6)

## 2024-08-07 LAB — STREP PNEUMONIAE URINARY ANTIGEN: Strep Pneumo Urinary Antigen: NEGATIVE

## 2024-08-07 LAB — MRSA NEXT GEN BY PCR, NASAL: MRSA by PCR Next Gen: NOT DETECTED

## 2024-08-07 LAB — CORTISOL: Cortisol, Plasma: 14.4 ug/dL

## 2024-08-07 LAB — HIV ANTIBODY (ROUTINE TESTING W REFLEX): HIV Screen 4th Generation wRfx: NONREACTIVE

## 2024-08-07 MED ORDER — HYDROCORTISONE SOD SUC (PF) 100 MG IJ SOLR
100.0000 mg | Freq: Once | INTRAMUSCULAR | Status: AC
  Administered 2024-08-07: 100 mg via INTRAVENOUS
  Filled 2024-08-07: qty 2

## 2024-08-07 MED ORDER — ACETAMINOPHEN 650 MG RE SUPP: 650.0000 mg | Freq: Four times a day (QID) | RECTAL | Status: DC | PRN

## 2024-08-07 MED ORDER — GABAPENTIN 300 MG PO CAPS: 300.0000 mg | ORAL_CAPSULE | Freq: Two times a day (BID) | ORAL | Status: DC | PRN

## 2024-08-07 MED ORDER — MIDODRINE HCL 5 MG PO TABS
10.0000 mg | ORAL_TABLET | Freq: Three times a day (TID) | ORAL | Status: DC
  Administered 2024-08-07: 10 mg via ORAL
  Filled 2024-08-07: qty 2

## 2024-08-07 MED ORDER — SODIUM CHLORIDE 0.9 % IV BOLUS
500.0000 mL | Freq: Once | INTRAVENOUS | Status: AC
  Administered 2024-08-07: 500 mL via INTRAVENOUS

## 2024-08-07 MED ORDER — GLUCERNA SHAKE PO LIQD
237.0000 mL | Freq: Three times a day (TID) | ORAL | Status: DC
  Administered 2024-08-07 – 2024-08-09 (×6): 237 mL via ORAL
  Filled 2024-08-07 (×2): qty 237

## 2024-08-07 MED ORDER — LACTATED RINGERS IV BOLUS
500.0000 mL | Freq: Once | INTRAVENOUS | Status: AC
  Administered 2024-08-07: 500 mL via INTRAVENOUS

## 2024-08-07 MED ORDER — IOHEXOL 300 MG/ML  SOLN
100.0000 mL | Freq: Once | INTRAMUSCULAR | Status: AC | PRN
  Administered 2024-08-07: 100 mL via INTRAVENOUS

## 2024-08-07 MED ORDER — CHLORHEXIDINE GLUCONATE CLOTH 2 % EX PADS
6.0000 | MEDICATED_PAD | Freq: Every day | CUTANEOUS | Status: DC
  Administered 2024-08-07 – 2024-08-09 (×3): 6 via TOPICAL

## 2024-08-07 MED ORDER — METRONIDAZOLE 500 MG/100ML IV SOLN
500.0000 mg | Freq: Once | INTRAVENOUS | Status: AC
  Administered 2024-08-07: 500 mg via INTRAVENOUS
  Filled 2024-08-07: qty 100

## 2024-08-07 MED ORDER — LACTATED RINGERS IV BOLUS
1000.0000 mL | Freq: Once | INTRAVENOUS | Status: AC
  Administered 2024-08-07: 1000 mL via INTRAVENOUS

## 2024-08-07 MED ORDER — ADULT MULTIVITAMIN W/MINERALS CH
1.0000 | ORAL_TABLET | Freq: Every day | ORAL | Status: DC
  Administered 2024-08-07 – 2024-08-09 (×3): 1 via ORAL
  Filled 2024-08-07 (×3): qty 1

## 2024-08-07 MED ORDER — ENOXAPARIN SODIUM 40 MG/0.4ML IJ SOSY
40.0000 mg | PREFILLED_SYRINGE | INTRAMUSCULAR | Status: DC
  Administered 2024-08-07 – 2024-08-09 (×3): 40 mg via SUBCUTANEOUS
  Filled 2024-08-07 (×3): qty 0.4

## 2024-08-07 MED ORDER — MAGNESIUM SULFATE 2 GM/50ML IV SOLN
2.0000 g | Freq: Once | INTRAVENOUS | Status: AC
  Administered 2024-08-07: 2 g via INTRAVENOUS
  Filled 2024-08-07: qty 50

## 2024-08-07 MED ORDER — SODIUM CHLORIDE 0.9 % IV SOLN
500.0000 mg | INTRAVENOUS | Status: DC
  Administered 2024-08-07 – 2024-08-09 (×3): 500 mg via INTRAVENOUS
  Filled 2024-08-07 (×3): qty 5

## 2024-08-07 MED ORDER — TAMSULOSIN HCL 0.4 MG PO CAPS
0.4000 mg | ORAL_CAPSULE | Freq: Every day | ORAL | Status: DC
  Administered 2024-08-08 – 2024-08-09 (×2): 0.4 mg via ORAL
  Filled 2024-08-07 (×2): qty 1

## 2024-08-07 MED ORDER — MIDODRINE HCL 5 MG PO TABS
5.0000 mg | ORAL_TABLET | Freq: Three times a day (TID) | ORAL | Status: DC
  Administered 2024-08-07 – 2024-08-08 (×3): 5 mg via ORAL
  Filled 2024-08-07 (×3): qty 1

## 2024-08-07 MED ORDER — ONDANSETRON HCL 4 MG PO TABS
4.0000 mg | ORAL_TABLET | Freq: Four times a day (QID) | ORAL | Status: DC | PRN
  Administered 2024-08-09: 4 mg via ORAL
  Filled 2024-08-07: qty 1

## 2024-08-07 MED ORDER — LACTATED RINGERS IV SOLN: INTRAVENOUS | Status: AC

## 2024-08-07 MED ORDER — LACTATED RINGERS IV BOLUS (SEPSIS)
500.0000 mL | Freq: Once | INTRAVENOUS | Status: AC
  Administered 2024-08-07: 500 mL via INTRAVENOUS

## 2024-08-07 MED ORDER — SODIUM CHLORIDE 0.9 % IV SOLN
2.0000 g | Freq: Once | INTRAVENOUS | Status: AC
  Administered 2024-08-07: 2 g via INTRAVENOUS
  Filled 2024-08-07: qty 12.5

## 2024-08-07 MED ORDER — PANTOPRAZOLE SODIUM 40 MG PO TBEC
40.0000 mg | DELAYED_RELEASE_TABLET | Freq: Every day | ORAL | Status: DC
  Administered 2024-08-07 – 2024-08-09 (×3): 40 mg via ORAL
  Filled 2024-08-07 (×3): qty 1

## 2024-08-07 MED ORDER — LACTATED RINGERS IV BOLUS (SEPSIS)
1000.0000 mL | Freq: Once | INTRAVENOUS | Status: AC
  Administered 2024-08-07: 1000 mL via INTRAVENOUS

## 2024-08-07 MED ORDER — DM-GUAIFENESIN ER 30-600 MG PO TB12
1.0000 | ORAL_TABLET | Freq: Two times a day (BID) | ORAL | Status: DC
  Administered 2024-08-07 – 2024-08-09 (×5): 1 via ORAL
  Filled 2024-08-07 (×5): qty 1

## 2024-08-07 MED ORDER — VANCOMYCIN HCL IN DEXTROSE 1-5 GM/200ML-% IV SOLN
1000.0000 mg | Freq: Once | INTRAVENOUS | Status: AC
  Administered 2024-08-07: 1000 mg via INTRAVENOUS
  Filled 2024-08-07: qty 200

## 2024-08-07 MED ORDER — ACETAMINOPHEN 500 MG PO TABS
1000.0000 mg | ORAL_TABLET | Freq: Once | ORAL | Status: AC
  Administered 2024-08-07: 1000 mg via ORAL
  Filled 2024-08-07: qty 2

## 2024-08-07 MED ORDER — ACETAMINOPHEN 325 MG PO TABS: 650.0000 mg | ORAL_TABLET | Freq: Four times a day (QID) | ORAL | Status: DC | PRN

## 2024-08-07 MED ORDER — MULTIVITAMINS PO CAPS: 1.0000 | ORAL_CAPSULE | Freq: Every day | ORAL | Status: DC

## 2024-08-07 MED ORDER — FERROUS SULFATE 325 (65 FE) MG PO TABS
325.0000 mg | ORAL_TABLET | Freq: Every day | ORAL | Status: DC
  Administered 2024-08-07 – 2024-08-09 (×3): 325 mg via ORAL
  Filled 2024-08-07 (×3): qty 1

## 2024-08-07 MED ORDER — INSULIN ASPART 100 UNIT/ML IJ SOLN
0.0000 [IU] | Freq: Three times a day (TID) | INTRAMUSCULAR | Status: DC
  Administered 2024-08-07 – 2024-08-08 (×4): 1 [IU] via SUBCUTANEOUS

## 2024-08-07 MED ORDER — ONDANSETRON HCL 4 MG/2ML IJ SOLN
4.0000 mg | Freq: Four times a day (QID) | INTRAMUSCULAR | Status: DC | PRN
  Administered 2024-08-07: 4 mg via INTRAVENOUS
  Filled 2024-08-07: qty 2

## 2024-08-07 MED ORDER — SODIUM CHLORIDE 0.9 % IV SOLN
1.0000 g | INTRAVENOUS | Status: DC
  Administered 2024-08-07 – 2024-08-09 (×3): 1 g via INTRAVENOUS
  Filled 2024-08-07 (×3): qty 10

## 2024-08-07 NOTE — Hospital Course (Addendum)
 Eric Vega is a 62 y.o. male with medical history significant of hypertension, T2DM, GERD who presents to the emergency department due to chills which started a few hours prior to arrival to the ED.  He states that while he was preparing his meals this evening, he started to have chills, so he went to bed with the hope that he would feel better on waking up, on the contrary, did not feel she was getting worse, so niece brought him to the ED for further evaluation and management.   ED Course:  In the emergency department, he was febrile (103.94F), tachypneic and tachycardic.  Workup in the ED showed normal CBC except for leukocytosis with WBC of 13.7.  BMP was normal except for glucose of 141.  Lactic acid x 2 was normal, alcohol level was undetectable.  Urine drug screen was normal, urinalysis was normal.  Influenza A, B, SARS-CoV-2, RSV was negative.  Blood culture pending. CT chest, abdomen and pelvis showed diffuse peribronchovascular vague patchy ground-glass airspace opacities that are most prominent within the right middle lobe.  No acute intra-abdominal or intrapelvic abnormality. IV cefepime , metronidazole  and vancomycin .  IV hydration was provided.    Assessment & Plan:   Principal Problem:   Sepsis due to pneumonia [facility]) Active Problems:   Type 2 diabetes mellitus with hyperglycemia (HCC)   Essential hypertension   GERD (gastroesophageal reflux disease)   Underweight (BMI < 18.5)      Sepsis due to pneumonia - still meeting Sepsis criteria - Hypotensive  Blood pressure 80/50, pulse 84, temperature (!) 97.3 F (36.3 C), RR 16, SpO2 98% RA   -Patient met sepsis criteria due to being febrile, tachycardic and hyperleukocytosis (met SIRS criteria).   - CT chest with contrast was suggestive of pneumonia -  in ED Patient got Cefepime , Metronidazole  and vancomycin ,  we will continue with continue ceftriaxone  and azithromycin  - will F/up with Blood culture, sputum culture, urine  Legionella, strep pneumo  - Procalcitonin  0.26. Lactic Acid 1.1 - Continue Tylenol  as needed - Continue Mucinex , incentive spirometry, flutter valve    Essential hypertension - Hypotensive  - holding BP meds, starting Midodrine      Type 2 diabetes mellitus Hemoglobin A1c on 04/24/23 was 7.0; this will be rechecked Continue ISS and hypoglycemia protocol   GERD Continue Protonix    Hypomagnesemia  - Magnesium  replaced 2 g IV  underweight (BMI 17.61) Protein supplement will be provided Dietitian will be consulted and we shall await further recommendations

## 2024-08-07 NOTE — ED Notes (Signed)
 Patient transported to CT

## 2024-08-07 NOTE — ED Notes (Signed)
 Dr Lorette notified of pt's BP of 80/50.
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
## 2024-08-07 NOTE — H&P (Signed)
 History and Physical    Patient: Eric Vega FMW:[id number] DOB: [birth year] DOA: 08/06/2024 DOS: the patient was seen and examined on 08/07/2024 PCP: [name], [name], FNP  Patient coming from: Home  Chief Complaint:  Chief Complaint  Patient presents with   Chills   HPI: [name] is a 63 y.o. male with medical history significant of hypertension, T2DM, GERD who presents to the emergency department due to chills which started a few hours prior to arrival to the ED.  He states that while he was preparing his meals this evening, he started to have chills, so he went to bed with the hope that he would feel better on waking up, on the contrary, did not feel she was getting worse, so niece brought him to the ED for further evaluation and management.  ED Course:  In the emergency department, he was febrile (103.37F), tachypneic and tachycardic.  Workup in the ED showed normal CBC except for leukocytosis with WBC of 13.7.  BMP was normal except for glucose of 141.  Lactic acid x 2 was normal, alcohol level was undetectable.  Urine drug screen was normal, urinalysis was normal.  Influenza A, B, SARS-CoV-2, RSV was negative.  Blood culture pending. CT chest, abdomen and pelvis showed diffuse peribronchovascular vague patchy ground-glass airspace opacities that are most prominent within the right middle lobe.  No acute intra-abdominal or intrapelvic abnormality. IV cefepime , metronidazole  and vancomycin .  IV hydration was provided.   Review of Systems: Review of systems as noted in the HPI. All other systems reviewed and are negative.   Past Medical History:  Diagnosis Date   Acute febrile illness 11/14/2022   Acute hypoxemic respiratory failure (HCC) 06/03/2022   Acute respiratory failure with hypoxia and hypercapnia (HCC) 06/03/2020   Closed fracture of multiple ribs of both sides 06/03/2020   Closed fracture of shaft of left femur with delayed healing 06/03/2020   Critical polytrauma  06/03/2020   Diabetes mellitus without complication (HCC)    Difficulty liberating from mechanical ventilation (HCC) 06/03/2020   Hemothorax with pneumothorax, traumatic 06/03/2020   Hypoalbuminemia due to protein-calorie malnutrition (HCC) 11/14/2022   Hypomagnesemia 06/04/2022   Hyponatremia 06/04/2022   Major depressive disorder with single episode, 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   Malnutrition of moderate degree 06/05/2022   Normocytic anemia 11/14/2022   Pericardial effusion 06/03/2020   Sepsis due to Enterococcus (HCC) 11/17/2022   Sepsis without acute organ dysfunction (HCC) 06/04/2022   SIRS (systemic inflammatory response syndrome) (HCC) 11/14/2022   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/03/2020   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 He has never used smokeless tobacco. He reports that he does not currently use alcohol. He reports that he does not currently use drugs.   No Known Allergies  History reviewed. No pertinent family history.    Prior to Admission medications   Medication Sig Start Date End Date Taking? Authorizing Provider  albuterol  (VENTOLIN  HFA) 108 (90 Base) MCG/ACT inhaler Inhale 2 puffs into the lungs every 6 (six) hours as needed for wheezing or shortness of breath.    [provider]  Cholecalciferol (VITAMIN D3) 50 MCG (  2000 UT) CAPS Take 1 capsule by mouth daily. 02/22/24   [provider]  ferrous sulfate  325 (65 FE) MG tablet Take 1 tablet (325 mg total) by mouth daily with breakfast. Patient not taking: Reported on 07/29/2023 04/25/23 07/24/23  Ricky Fines, MD  gabapentin   (NEURONTIN ) 300 MG capsule Take 300 mg by mouth 2 (two) times daily as needed (neuropathy, nerve pain). 02/07/24   [provider]  losartan (COZAAR) 50 MG tablet Take 50 mg by mouth daily. 02/22/24   [provider]  metFORMIN  (GLUCOPHAGE ) 500 MG tablet Take 1 tablet (500 mg total) by mouth daily with breakfast. 06/07/22 07/29/23  Willette Adriana LABOR, MD  Multiple Vitamin (MULTIVITAMIN) capsule Take 1 capsule by mouth daily. 03/05/24   Haviland, Julie, MD  Multiple Vitamin (MULTIVITAMIN) tablet Take 1 tablet by mouth daily.    [provider]  ondansetron  (ZOFRAN -ODT) 4 MG disintegrating tablet Take 1 tablet (4 mg total) by mouth every 8 (eight) hours as needed. 03/05/24   Dean Clarity, MD  pantoprazole  (PROTONIX ) 40 MG tablet Take 1 tablet (40 mg total) by mouth daily. 07/29/23   Kennedy Charmaine CROME, NP  tamsulosin  (FLOMAX ) 0.4 MG CAPS capsule Take 1 capsule (0.4 mg total) by mouth daily after breakfast. 06/10/23   Haze Lonni PARAS, MD    Physical Exam: BP (!) 92/55   Pulse 81   Temp (!) 97.3 F (36.3 C) (Axillary)   Resp (!) 23   Ht 5' 5 (1.651 m)   Wt 48 kg   SpO2 96%   BMI 17.61 kg/m   General: 63 y.o. year-old male. Ill appearing, but in no acute distress.  Alert and oriented x3. HEENT: NCAT, EOMI Neck: Supple, trachea medial Cardiovascular: Tachycardia.  Regular rate and rhythm with no rubs or gallops.  No thyromegaly or JVD noted.  No lower extremity edema. 2/4 pulses in all 4 extremities. Respiratory: Rhonchi on auscultation of RML and RLL with no wheezes.  Abdomen: Soft, nontender nondistended with normal bowel sounds x4 quadrants. Muskuloskeletal: No cyanosis, clubbing or edema noted bilaterally Neuro: CN II-XII intact, strength 5/5 x 4, sensation, reflexes intact Skin: No ulcerative lesions noted or rashes Psychiatry: Judgement and insight appear normal. Mood is appropriate for condition and setting          Labs on Admission:  Basic Metabolic  Panel: Recent Labs  Lab 08/06/24 2229  NA 135  K 4.4  CL 99  CO2 24  GLUCOSE 141*  BUN 22  CREATININE 0.72  CALCIUM 9.1   Liver Function Tests: Recent Labs  Lab 08/06/24 2229  AST 22  ALT 21  ALKPHOS 86  BILITOT 0.6  PROT 7.8  ALBUMIN  4.1   No results for input(s): LIPASE, AMYLASE in the last 168 hours. No results for input(s): AMMONIA in the last 168 hours. CBC: Recent Labs  Lab 08/06/24 2229  WBC 13.7*  NEUTROABS 11.1*  HGB 13.0  HCT 37.8*  MCV 87.1  PLT 213   Cardiac Enzymes: No results for input(s): CKTOTAL, CKMB, CKMBINDEX, TROPONINI in the last 168 hours.  BNP (last 3 results) No results for input(s): BNP in the last 8760 hours.  ProBNP (last 3 results) No results for input(s): PROBNP in the last 8760 hours.  CBG: No results for input(s): GLUCAP in the last 168 hours.  Radiological Exams on Admission: CT CHEST ABDOMEN PELVIS W CONTRAST Result Date: 08/07/2024 CLINICAL DATA:  Sepsis mental status change. Unsure of pt complaint. Pt in hospital provided wheelchair with  blanket covering him from head to toe. Pt just moans. When asking pt what we are seeing him for, pt states cold. EXAM: CT CHEST, ABDOMEN, AND PELVIS WITH CONTRAST TECHNIQUE: Multidetector CT imaging of the chest, abdomen and pelvis was performed following the standard protocol during bolus administration of intravenous contrast. RADIATION DOSE REDUCTION: This exam was performed according to the departmental dose-optimization program which includes automated exposure control, adjustment of the mA and/or kV according to patient size and/or use of iterative reconstruction technique. CONTRAST:  OMNIPAQUE  IOHEXOL  300 MG/ML  SOLN COMPARISON:  Chest x-ray 08/06/2024, chest x-ray 07/16/2023, CT abdomen pelvis 08/28/2023 FINDINGS: CT CHEST FINDINGS Cardiovascular: Normal heart size. No significant pericardial effusion. The thoracic aorta is normal in caliber. Moderate to severe  atherosclerotic plaque of the thoracic aorta and its main branches. At least 2 vessel coronary artery calcifications. Mediastinum/Nodes: No enlarged mediastinal, hilar, or axillary lymph nodes. Thyroid  gland, trachea, and esophagus demonstrate no significant findings. Lungs/Pleura: Diffuse peribronchovascular vague patchy ground-glass airspace opacities that are most prominent within the right middle lobe. No pulmonary mass. No pleural effusion. No pneumothorax. Musculoskeletal: No chest wall abnormality. No suspicious lytic or blastic osseous lesions. No acute displaced fracture. Old healed bilateral rib fractures. Plate and screw fixation of the left ribs. CT ABDOMEN PELVIS FINDINGS Hepatobiliary: No focal liver abnormality. No gallstones, gallbladder wall thickening, or pericholecystic fluid. No biliary dilatation. Pancreas: Diffusely atrophic. No focal lesion. Otherwise normal pancreatic contour. No surrounding inflammatory changes. No main pancreatic ductal dilatation. Spleen: Normal in size without focal abnormality. Adrenals/Urinary Tract: No adrenal nodule bilaterally. Bilateral kidneys enhance symmetrically. No hydronephrosis. No hydroureter. The urinary bladder is unremarkable. Stomach/Bowel: Stomach is within normal limits. No evidence of bowel wall thickening or dilatation. Stool throughout the majority of the colon. Appendix appears normal. Vascular/Lymphatic: No abdominal aorta or iliac aneurysm. Moderate atherosclerotic plaque of the aorta and its branches. No abdominal, pelvic, or inguinal lymphadenopathy. Reproductive: Prostate is unremarkable. Other: No intraperitoneal free fluid. No intraperitoneal free gas. No organized fluid collection. Musculoskeletal: No abdominal wall hernia or abnormality. No suspicious lytic or blastic osseous lesions. No acute displaced fracture. Multilevel degenerative changes of the spine. Partially visualized intramedullary nail fixation of the left IMPRESSION: 1.  Diffuse peribronchovascular vague patchy ground-glass airspace opacities that are most prominent within the right middle lobe. Findings suggestive of infection/inflammation. Recommend follow-up CT in 3 months to evaluate for complete resolution. 2. No acute intra-abdominal or intrapelvic abnormality. Electronically Signed   By: Morgane  Naveau M.D.   On: 08/07/2024 01:03   CT Head Wo Contrast Result Date: 08/07/2024 CLINICAL DATA:  Mental status change, unknown cause EXAM: CT HEAD WITHOUT CONTRAST TECHNIQUE: Contiguous axial images were obtained from the base of the skull through the vertex without intravenous contrast. RADIATION DOSE REDUCTION: This exam was performed according to the departmental dose-optimization program which includes automated exposure control, adjustment of the mA and/or kV according to patient size and/or use of iterative reconstruction technique. COMPARISON:  MRI head 03/05/2024, CT head 03/05/2024 FINDINGS: Brain: No evidence of large-territorial acute infarction. No parenchymal hemorrhage. No mass lesion. No extra-axial collection. No mass effect or midline shift. No hydrocephalus. Basilar cisterns are patent. Vascular: No hyperdense vessel. Atherosclerotic calcifications are present within the cavernous internal carotid arteries. Skull: No acute fracture or focal lesion. Sinuses/Orbits: Paranasal sinuses and mastoid air cells are clear. The orbits are unremarkable. Other: None. IMPRESSION: No acute intracranial abnormality. Electronically Signed   By: Morgane  Naveau M.D.   On: 08/07/2024 00:57  DG Chest Portable 1 View Result Date: 08/06/2024 CLINICAL DATA:  r/o PNA EXAM: PORTABLE CHEST 1 VIEW COMPARISON:  Chest x-ray 07/16/2023, CT chest 11/13/2022 FINDINGS: The heart and mediastinal contours are within normal limits. Low lung volumes. No focal consolidation. No pulmonary edema. No pleural effusion. No pneumothorax. No acute osseous abnormality. Similar-appearing plate and screw  fixation of the left ribs. Old healed right rib fractures. IMPRESSION: Low lung volumes with no active disease. Electronically Signed   By: Morgane  Naveau M.D.   On: 08/06/2024 23:21    EKG: I independently viewed the EKG done and my findings are as followed: EKG was not done in the ED  Assessment/Plan Present on Admission:  Sepsis due to pneumonia (HCC)  Type 2 diabetes mellitus with hyperglycemia (HCC)  Principal Problem:   Sepsis due to pneumonia [facility]) Active Problems:   Type 2 diabetes mellitus with hyperglycemia (HCC)   Essential hypertension   GERD (gastroesophageal reflux disease)   Underweight (BMI < 18.5)  Sepsis due to pneumonia Patient met sepsis criteria due to being febrile, tachycardic and hyperleukocytosis (met SIRS criteria).  CT chest with contrast was suggestive of pneumonia Patient was started on cefepime , metronidazole  and vancomycin , we shall continue ceftriaxone  and azithromycin  at this time with plan to de-escalate/discontinue based on blood culture, sputum culture, urine Legionella, strep pneumo and procalcitonin Continue Tylenol  as needed Continue Mucinex , incentive spirometry, flutter valve   Essential hypertension Home BP meds will be held at this time due to soft BP  Type 2 diabetes mellitus Hemoglobin A1c on 04/24/23 was 7.0; this will be rechecked Continue ISS and hypoglycemia protocol  GERD Continue Protonix   Underweight (BMI 17.61) Protein supplement will be provided Dietitian will be consulted and we shall await further recommendations  DVT prophylaxis: Lovenox   Code Status: Full code  Family Communication: None at bedside  Consults: None  Severity of Illness: The appropriate patient status for this patient is INPATIENT. Inpatient status is judged to be reasonable and necessary in order to provide the required intensity of service to ensure the patient's safety. The patient's presenting symptoms, physical exam findings, and initial  radiographic and laboratory data in the context of their chronic comorbidities is felt to place them at high risk for further clinical deterioration. Furthermore, it is not anticipated that the patient will be medically stable for discharge from the hospital within 2 midnights of admission.   * I certify that at the point of admission it is my clinical judgment that the patient will require inpatient hospital care spanning beyond 2 midnights from the point of admission due to high intensity of service, high risk for further deterioration and high frequency of surveillance required.*  Author: Reiko Vinje, DO 08/07/2024 4:28 AM  For on call review [email or web address].

## 2024-08-07 NOTE — Sepsis Progress Note (Signed)
 Elink following for sepsis protocol.

## 2024-08-07 NOTE — Progress Notes (Signed)
   08/07/24 1000  TOC Brief Assessment  Insurance and Status Reviewed  Patient has primary care physician Yes  Home environment has been reviewed Single family home  Prior level of function: Independent  Prior/Current Home Services No current home services  Social Drivers of Health Review SDOH reviewed no interventions necessary  Readmission risk has been reviewed Yes  Transition of care needs no transition of care needs at this time

## 2024-08-07 NOTE — Progress Notes (Addendum)
 Initial Nutrition Assessment  DOCUMENTATION CODES:   Non-severe (moderate) malnutrition in context of chronic illness  INTERVENTION:   Continue Glucerna Shake po TID, each supplement provides 220 kcal and 10 grams of protein Continue MVI with minerals daily Request new weight  NUTRITION DIAGNOSIS:   Moderate Malnutrition related to chronic illness as evidenced by mild muscle depletion, mild fat depletion.  GOAL:   Patient will meet greater than or equal to 90% of their needs  MONITOR:   PO intake, Supplement acceptance  REASON FOR ASSESSMENT:   Consult Assessment of nutrition requirement/status  ASSESSMENT:   63 yo male admitted with sepsis d/t PNA. PMH includes DM-2, HTN, GERD, malnutrition, L lung transplant.  Attempted to use language translation tablet to speak with patient, but it did not work. Unable to obtain nutrition hx from patient at this time.    Currently on a heart healthy carbohydrate modified diet.  Meal intakes not recorded.  Labs reviewed. Na 133, mag 1.6 CBG: 130  Medications reviewed and include ferrous sulfate , solu-cortef , novolog , MVI, protonix , flomax , mag sulfate x 1. IVF: LR at 150 ml/h  Weight history reviewed.  Patient with 25% weight loss over the past year.  Question accuracy of current weight, will request a new weight.   Patient meets criteria for moderate malnutrition, given mild depletion of muscle and subcutaneous fat mass.  NUTRITION - FOCUSED PHYSICAL EXAM:  Flowsheet Row Most Recent Value  Orbital Region Moderate depletion  Upper Arm Region Mild depletion  Thoracic and Lumbar Region Mild depletion  Buccal Region Moderate depletion  Temple Region Moderate depletion  Clavicle Bone Region Mild depletion  Clavicle and Acromion Bone Region Mild depletion  Scapular Bone Region Mild depletion  Dorsal Hand Mild depletion  Patellar Region Mild depletion  Anterior Thigh Region Mild depletion  Posterior Calf Region Mild  depletion  Edema (RD Assessment) None  Hair Reviewed  Eyes Reviewed  Mouth Unable to assess  Skin Reviewed  Nails Reviewed    Diet Order:   Diet Order             Diet heart healthy/carb modified Room service appropriate? Yes; Fluid consistency: Thin  Diet effective now                   EDUCATION NEEDS:   Not appropriate for education at this time  Skin:  Skin Assessment: Reviewed RN Assessment  Last BM:  8/25  Height:   Ht Readings from Last 1 Encounters:  08/06/24 5' 5 (1.651 m)    Weight:   Wt Readings from Last 1 Encounters:  08/06/24 48 kg    Ideal Body Weight:  61.8 kg  BMI:  Body mass index is 17.61 kg/m.  Estimated Nutritional Needs:   Kcal:  1600-1800  Protein:  75-90 gm  Fluid:  1.6-1.8 L   Suzen HUNT RD, LDN, CNSC Contact via secure chat. If unavailable, use group chat RD Inpatient.

## 2024-08-07 NOTE — Progress Notes (Signed)
 PROGRESS NOTE    Patient: Eric Vega                            PCP: [name], [name], FNP                    DOB: 11-Jul-1961            DOA: 08/06/2024 FMW:[id number]             DOS: 08/07/2024, 7:22 AM   LOS: 0 days   Date of Service: The patient was seen and examined on 08/07/2024  Subjective:   The patient was seen and examined this morning. Temp improved, still hypotensive, awake alert Satting 98% on room air Mildly lethargic but awake, following,  Communication via interpreter  Brief Narrative:   Eric Vega is a 63 y.o. male with medical history significant of hypertension, T2DM, GERD who presents to the emergency department due to chills which started a few hours prior to arrival to the ED.  He states that while he was preparing his meals this evening, he started to have chills, so he went to bed with the hope that he would feel better on waking up, on the contrary, did not feel she was getting worse, so niece brought him to the ED for further evaluation and management.   ED Course:  In the emergency department, he was febrile (103.72F), tachypneic and tachycardic.  Workup in the ED showed normal CBC except for leukocytosis with WBC of 13.7.  BMP was normal except for glucose of 141.  Lactic acid x 2 was normal, alcohol level was undetectable.  Urine drug screen was normal, urinalysis was normal.  Influenza A, B, SARS-CoV-2, RSV was negative.  Blood culture pending. CT chest, abdomen and pelvis showed diffuse peribronchovascular vague patchy ground-glass airspace opacities that are most prominent within the right middle lobe.  No acute intra-abdominal or intrapelvic abnormality. IV cefepime , metronidazole  and vancomycin .  IV hydration was provided.    Assessment & Plan:   Principal Problem:   Sepsis due to pneumonia [facility]) Active Problems:   Type 2 diabetes mellitus with hyperglycemia (HCC)   Essential hypertension   GERD (gastroesophageal reflux disease)    Underweight (BMI < 18.5)      Sepsis due to pneumonia - still meeting Sepsis criteria - Hypotensive  Blood pressure 80/50, pulse 84, temperature (!) 97.3 F (36.3 C), RR 16, SpO2 98% RA   -Patient met sepsis criteria due to being febrile, tachycardic and hyperleukocytosis (met SIRS criteria).   - CT chest with contrast was suggestive of pneumonia -  in ED Patient got Cefepime , Metronidazole  and vancomycin ,  we will continue with continue ceftriaxone  and azithromycin  - will F/up with Blood culture, sputum culture, urine Legionella, strep pneumo  - Procalcitonin  0.26. Lactic Acid 1.1 - Continue Tylenol  as needed - Continue Mucinex , incentive spirometry, flutter valve    Essential hypertension - Hypotensive  - holding BP meds, starting Midodrine      Type 2 diabetes mellitus Hemoglobin A1c on 04/24/23 was 7.0; this will be rechecked Continue ISS and hypoglycemia protocol   GERD Continue Protonix    Underweight (BMI 17.61) Protein supplement will be provided Dietitian will be consulted and we shall await further recommendations     ----------------------------------------------------------------------------------------------------------------------------------------------- Nutritional status:  The patient's BMI is: Body mass index is 17.61 kg/m. I agree with the assessment and plan as outlined   ---------------------------------------------------------------------------------------------------------------------------------------------------- Cultures; Blood Cultures x 2 >>  Urine Culture  >>>  Sputum Culture >>   ------------------------------------------------------------------------------------------------------------------------------------------------  DVT prophylaxis:  enoxaparin  (LOVENOX ) injection 40 mg Start: 08/07/24 1000 SCDs Start: 08/07/24 0410   Code Status:   Code Status: Full Code  Family Communication: No family member present at bedside-  -Advance  care planning has been discussed.   Admission status:   Status is: Inpatient Remains inpatient appropriate because: Admitted to stepdown, treating sepsis needing IV fluids IV antibiotics   Disposition: From  - home             Planning for discharge in 1-2 days   Procedures:   No admission procedures for hospital encounter.   Antimicrobials:  Anti-infectives (From admission, onward)    Start     Dose/Rate Route Frequency Ordered Stop   08/07/24 1000  azithromycin  (ZITHROMAX ) 500 mg in sodium chloride  0.9 % 250 mL IVPB        500 mg 250 mL/hr over 60 Minutes Intravenous Every 24 hours 08/07/24 0402     08/07/24 0800  cefTRIAXone  (ROCEPHIN ) 1 g in sodium chloride  0.9 % 100 mL IVPB        1 g 200 mL/hr over 30 Minutes Intravenous Every 24 hours 08/07/24 0402     08/07/24 0015  ceFEPIme  (MAXIPIME ) 2 g in sodium chloride  0.9 % 100 mL IVPB        2 g 200 mL/hr over 30 Minutes Intravenous  Once 08/07/24 0005 08/07/24 0120   08/07/24 0015  metroNIDAZOLE  (FLAGYL ) IVPB 500 mg        500 mg 100 mL/hr over 60 Minutes Intravenous  Once 08/07/24 0005 08/07/24 0216   08/07/24 0015  vancomycin  (VANCOCIN ) IVPB 1000 mg/200 mL premix        1,000 mg 200 mL/hr over 60 Minutes Intravenous  Once 08/07/24 0005 08/07/24 0217        Medication:   Chlorhexidine  Gluconate Cloth  6 each Topical Daily   dextromethorphan -guaiFENesin   1 tablet Oral BID   enoxaparin  (LOVENOX ) injection  40 mg Subcutaneous Q24H   feeding supplement (GLUCERNA SHAKE)  237 mL Oral TID BM   ferrous sulfate   325 mg Oral Q breakfast   hydrocortisone  sod succinate (SOLU-CORTEF ) inj  100 mg Intravenous Once   insulin  aspart  0-9 Units Subcutaneous TID WC   midodrine   10 mg Oral TID WC   multivitamin  1 capsule Oral Daily   pantoprazole   40 mg Oral Daily   [START ON 08/08/2024] tamsulosin   0.4 mg Oral QPC breakfast    acetaminophen  **OR** acetaminophen , ondansetron  **OR** ondansetron  (ZOFRAN ) IV   Objective:   Vitals:    08/07/24 0345 08/07/24 0400 08/07/24 0430 08/07/24 0527  BP: (!) 90/55 (!) 92/55 (!) 95/58 121/62  Pulse: 80 81 74 84  Resp: 16 (!) 23 15 16   Temp:      TempSrc:      SpO2: 94% 96% 95% 98%  Weight:      Height:        Intake/Output Summary (Last 24 hours) at 08/07/2024 0723 Last data filed at 08/07/2024 9281 Gross per 24 hour  Intake 4804.06 ml  Output 600 ml  Net 4204.06 ml   Filed Weights   08/06/24 2200  Weight: 48 kg     Physical examination:   General:  AAO x 3,  cooperative, no distress;   HEENT:  Normocephalic, PERRL, otherwise with in Normal limits   Neuro:  CNII-XII intact. , normal motor and sensation, reflexes intact   Lungs:  Clear to auscultation BL, Respirations unlabored,  No wheezes / crackles  Cardio:    S1/S2, RRR, No murmure, No Rubs or Gallops   Abdomen:  Soft, non-tender, bowel sounds active all four quadrants, no guarding or peritoneal signs.  Muscular  skeletal:  Limited exam -global generalized weaknesses - in bed, able to move all 4 extremities,   2+ pulses,  symmetric, No pitting edema  Skin:  Dry, warm to touch, negative for any Rashes,  Wounds: Please see nursing documentation       ------------------------------------------------------------------------------------------------------------------------------------------    LABs:     Latest Ref Rng & Units 08/07/2024    4:38 AM 08/06/2024   10:29 PM 03/05/2024    2:11 PM  CBC  WBC 4.0 - 10.5 K/uL 13.8  13.7  8.3   Hemoglobin 13.0 - 17.0 g/dL 88.6  86.9  88.2   Hematocrit 39.0 - 52.0 % 33.7  37.8  35.6   Platelets 150 - 400 K/uL 205  213  192       Latest Ref Rng & Units 08/07/2024    4:38 AM 08/06/2024   10:29 PM 03/05/2024    2:11 PM  CMP  Glucose 70 - 99 mg/dL 862  858  816   BUN 8 - 23 mg/dL 18  22  26    Creatinine 0.61 - 1.24 mg/dL 9.28  9.27  8.73   Sodium 135 - 145 mmol/L 133  135  133   Potassium 3.5 - 5.1 mmol/L 3.9  4.4  5.0   Chloride 98 - 111 mmol/L 104  99  101    CO2 22 - 32 mmol/L 24  24  25    Calcium 8.9 - 10.3 mg/dL 8.0  9.1  8.5   Total Protein 6.5 - 8.1 g/dL 5.7  7.8  7.2   Total Bilirubin 0.0 - 1.2 mg/dL 0.7  0.6  0.6   Alkaline Phos 38 - 126 U/L 63  86  80   AST 15 - 41 U/L 19  22  20    ALT 0 - 44 U/L 16  21  19         Micro Results Recent Results (from the past 240 hours)  Resp panel by RT-PCR (RSV, Flu A&B, Covid) Anterior Nasal Swab     Status: None   Collection Time: 08/06/24 10:46 PM   Specimen: Anterior Nasal Swab  Result Value Ref Range Status   SARS Coronavirus 2 by RT PCR NEGATIVE NEGATIVE Final    Comment: (NOTE) SARS-CoV-2 target nucleic acids are NOT DETECTED.  The SARS-CoV-2 RNA is generally detectable in upper respiratory specimens during the acute phase of infection. The lowest concentration of SARS-CoV-2 viral copies this assay can detect is 138 copies/mL. A negative result does not preclude SARS-Cov-2 infection and should not be used as the sole basis for treatment or other patient management decisions. A negative result may occur with  improper specimen collection/handling, submission of specimen other than nasopharyngeal swab, presence of viral mutation(s) within the areas targeted by this assay, and inadequate number of viral copies(<138 copies/mL). A negative result must be combined with clinical observations, patient history, and epidemiological information. The expected result is Negative.  Fact Sheet for Patients:  BloggerCourse.com  Fact Sheet for Healthcare Providers:  SeriousBroker.it  This test is no t yet approved or cleared by the [place]  FDA and  has been authorized for detection and/or diagnosis of SARS-CoV-2 by FDA under an Emergency Use Authorization (EUA). This EUA will remain  in effect (meaning this test can be used) for the duration of the COVID-19 declaration under Section 564(b)(1) of the Act, 21 U.S.C.section 360bbb-3(b)(1),  unless the authorization is terminated  or revoked sooner.       Influenza A by PCR NEGATIVE NEGATIVE Final   Influenza B by PCR NEGATIVE NEGATIVE Final    Comment: (NOTE) The Xpert Xpress SARS-CoV-2/FLU/RSV plus assay is intended as an aid in the diagnosis of influenza from Nasopharyngeal swab specimens and should not be used as a sole basis for treatment. Nasal washings and aspirates are unacceptable for Xpert Xpress SARS-CoV-2/FLU/RSV testing.  Fact Sheet for Patients: BloggerCourse.com  Fact Sheet for Healthcare Providers: SeriousBroker.it  This test is not yet approved or cleared by the [place]  FDA and has been authorized for detection and/or diagnosis of SARS-CoV-2 by FDA under an Emergency Use Authorization (EUA). This EUA will remain in effect (meaning this test can be used) for the duration of the COVID-19 declaration under Section 564(b)(1) of the Act, 21 U.S.C. section 360bbb-3(b)(1), unless the authorization is terminated or revoked.     Resp Syncytial Virus by PCR NEGATIVE NEGATIVE Final    Comment: (NOTE) Fact Sheet for Patients: BloggerCourse.com  Fact Sheet for Healthcare Providers: SeriousBroker.it  This test is not yet approved or cleared by the [place]  FDA and has been authorized for detection and/or diagnosis of SARS-CoV-2 by FDA under an Emergency Use Authorization (EUA). This EUA will remain in effect (meaning this test can be used) for the duration of the COVID-19 declaration under Section 564(b)(1) of the Act, 21 U.S.C. section 360bbb-3(b)(1), unless the authorization is terminated or revoked.  Performed at [facility] [address], [place], [place] [address]   Culture, blood (single)     Status: None (Preliminary result)   Collection Time: 08/07/24 12:15 AM   Specimen: BLOOD LEFT HAND  Result Value Ref Range Status   Specimen  Description BLOOD LEFT HAND  Final   Special Requests   Final    BOTTLES DRAWN AEROBIC AND ANAEROBIC Blood Culture adequate volume Performed at [facility], [address] [place], [place] [address]    Culture PENDING  Incomplete   Report Status PENDING  Incomplete    Radiology Reports CT CHEST ABDOMEN PELVIS W CONTRAST Result Date: 08/07/2024 CLINICAL DATA:  Sepsis mental status change. Unsure of pt complaint. Pt in hospital provided wheelchair with blanket covering him from head to toe. Pt just moans. When asking pt what we are seeing him for, pt states cold. EXAM: CT CHEST, ABDOMEN, AND PELVIS WITH CONTRAST TECHNIQUE: Multidetector CT imaging of the chest, abdomen and pelvis was performed following the standard protocol during bolus administration of intravenous contrast. RADIATION DOSE REDUCTION: This exam was performed according to the departmental dose-optimization program which includes automated exposure control, adjustment of the mA and/or kV according to patient size and/or use of iterative reconstruction technique. CONTRAST:  OMNIPAQUE  IOHEXOL  300 MG/ML  SOLN COMPARISON:  Chest x-ray 08/06/2024, chest x-ray 07/16/2023, CT abdomen pelvis 08/28/2023 FINDINGS: CT CHEST FINDINGS Cardiovascular: Normal heart size. No significant pericardial effusion. The thoracic aorta is normal in caliber. Moderate to severe atherosclerotic plaque of the thoracic aorta and its main branches. At least 2 vessel coronary artery calcifications. Mediastinum/Nodes: No enlarged mediastinal, hilar, or axillary lymph nodes. Thyroid  gland, trachea, and esophagus demonstrate no significant findings. Lungs/Pleura: Diffuse peribronchovascular vague patchy ground-glass airspace opacities that are most prominent within the right middle lobe. No pulmonary mass. No pleural effusion. No pneumothorax. Musculoskeletal:  No chest wall abnormality. No suspicious lytic or blastic osseous lesions. No acute displaced fracture. Old  healed bilateral rib fractures. Plate and screw fixation of the left ribs. CT ABDOMEN PELVIS FINDINGS Hepatobiliary: No focal liver abnormality. No gallstones, gallbladder wall thickening, or pericholecystic fluid. No biliary dilatation. Pancreas: Diffusely atrophic. No focal lesion. Otherwise normal pancreatic contour. No surrounding inflammatory changes. No main pancreatic ductal dilatation. Spleen: Normal in size without focal abnormality. Adrenals/Urinary Tract: No adrenal nodule bilaterally. Bilateral kidneys enhance symmetrically. No hydronephrosis. No hydroureter. The urinary bladder is unremarkable. Stomach/Bowel: Stomach is within normal limits. No evidence of bowel wall thickening or dilatation. Stool throughout the majority of the colon. Appendix appears normal. Vascular/Lymphatic: No abdominal aorta or iliac aneurysm. Moderate atherosclerotic plaque of the aorta and its branches. No abdominal, pelvic, or inguinal lymphadenopathy. Reproductive: Prostate is unremarkable. Other: No intraperitoneal free fluid. No intraperitoneal free gas. No organized fluid collection. Musculoskeletal: No abdominal wall hernia or abnormality. No suspicious lytic or blastic osseous lesions. No acute displaced fracture. Multilevel degenerative changes of the spine. Partially visualized intramedullary nail fixation of the left IMPRESSION: 1. Diffuse peribronchovascular vague patchy ground-glass airspace opacities that are most prominent within the right middle lobe. Findings suggestive of infection/inflammation. Recommend follow-up CT in 3 months to evaluate for complete resolution. 2. No acute intra-abdominal or intrapelvic abnormality. Electronically Signed   By: Morgane  Naveau M.D.   On: 08/07/2024 01:03   CT Head Wo Contrast Result Date: 08/07/2024 CLINICAL DATA:  Mental status change, unknown cause EXAM: CT HEAD WITHOUT CONTRAST TECHNIQUE: Contiguous axial images were obtained from the base of the skull through the  vertex without intravenous contrast. RADIATION DOSE REDUCTION: This exam was performed according to the departmental dose-optimization program which includes automated exposure control, adjustment of the mA and/or kV according to patient size and/or use of iterative reconstruction technique. COMPARISON:  MRI head 03/05/2024, CT head 03/05/2024 FINDINGS: Brain: No evidence of large-territorial acute infarction. No parenchymal hemorrhage. No mass lesion. No extra-axial collection. No mass effect or midline shift. No hydrocephalus. Basilar cisterns are patent. Vascular: No hyperdense vessel. Atherosclerotic calcifications are present within the cavernous internal carotid arteries. Skull: No acute fracture or focal lesion. Sinuses/Orbits: Paranasal sinuses and mastoid air cells are clear. The orbits are unremarkable. Other: None. IMPRESSION: No acute intracranial abnormality. Electronically Signed   By: Morgane  Naveau M.D.   On: 08/07/2024 00:57   DG Chest Portable 1 View Result Date: 08/06/2024 CLINICAL DATA:  r/o PNA EXAM: PORTABLE CHEST 1 VIEW COMPARISON:  Chest x-ray 07/16/2023, CT chest 11/13/2022 FINDINGS: The heart and mediastinal contours are within normal limits. Low lung volumes. No focal consolidation. No pulmonary edema. No pleural effusion. No pneumothorax. No acute osseous abnormality. Similar-appearing plate and screw fixation of the left ribs. Old healed right rib fractures. IMPRESSION: Low lung volumes with no active disease. Electronically Signed   By: Morgane  Naveau M.D.   On: 08/06/2024 23:21    SIGNED: Adriana DELENA Grams, MD, FHM. FAAFP. [facility] - [facility] Time spent - 55 min.  In seeing, evaluating and examining the patient. Reviewing medical records, labs, drawn plan of care. [facility],  Pager (please use [email or web address] to page/ text) Please use Epic Secure Chat for non-urgent communication (7AM-7PM)  If 7PM-7AM, please contact  night-coverage [email or web address], 08/07/2024, 7:23 AM

## 2024-08-08 DIAGNOSIS — J189 Pneumonia, unspecified organism: Secondary | ICD-10-CM | POA: Diagnosis not present

## 2024-08-08 DIAGNOSIS — A419 Sepsis, unspecified organism: Secondary | ICD-10-CM | POA: Diagnosis not present

## 2024-08-08 LAB — COMPREHENSIVE METABOLIC PANEL WITH GFR
ALT: 15 U/L (ref 0–44)
AST: 15 U/L (ref 15–41)
Albumin: 2.9 g/dL — ABNORMAL LOW (ref 3.5–5.0)
Alkaline Phosphatase: 56 U/L (ref 38–126)
Anion gap: 5 (ref 5–15)
BUN: 14 mg/dL (ref 8–23)
CO2: 27 mmol/L (ref 22–32)
Calcium: 8.1 mg/dL — ABNORMAL LOW (ref 8.9–10.3)
Chloride: 104 mmol/L (ref 98–111)
Creatinine, Ser: 0.66 mg/dL (ref 0.61–1.24)
GFR, Estimated: >60 mL/min (ref >60)
Glucose, Bld: 111 mg/dL — ABNORMAL HIGH (ref 70–99)
Potassium: 3.5 mmol/L (ref 3.5–5.1)
Sodium: 136 mmol/L (ref 135–145)
Total Bilirubin: 0.4 mg/dL (ref 0.0–1.2)
Total Protein: 5.7 g/dL — ABNORMAL LOW (ref 6.5–8.1)

## 2024-08-08 LAB — GLUCOSE, CAPILLARY
Glucose-Capillary: 93 mg/dL (ref 70–99)
Glucose-Capillary: 146 mg/dL — ABNORMAL HIGH (ref 70–99)
Glucose-Capillary: 145 mg/dL — ABNORMAL HIGH (ref 70–99)
Glucose-Capillary: 164 mg/dL — ABNORMAL HIGH (ref 70–99)

## 2024-08-08 LAB — CBC
HCT: 32.8 % — ABNORMAL LOW (ref 39.0–52.0)
Hemoglobin: 10.9 g/dL — ABNORMAL LOW (ref 13.0–17.0)
MCH: 29.2 pg (ref 26.0–34.0)
MCHC: 33.2 g/dL (ref 30.0–36.0)
MCV: 87.9 fL (ref 80.0–100.0)
Platelets: 210 K/uL (ref 150–400)
RBC: 3.73 MIL/uL — ABNORMAL LOW (ref 4.22–5.81)
RDW: 12.9 % (ref 11.5–15.5)
WBC: 10.4 K/uL (ref 4.0–10.5)
nRBC: 0 % (ref 0.0–0.2)

## 2024-08-08 MED ORDER — ALBUMIN HUMAN 5 % IV SOLN
25.0000 g | Freq: Once | INTRAVENOUS | Status: AC
  Administered 2024-08-08: 25 g via INTRAVENOUS
  Filled 2024-08-08: qty 500

## 2024-08-08 MED ORDER — FLORANEX PO PACK
1.0000 g | PACK | Freq: Three times a day (TID) | ORAL | Status: DC
  Administered 2024-08-08 – 2024-08-09 (×3): 1 g via ORAL
  Filled 2024-08-08 (×6): qty 1

## 2024-08-08 NOTE — Progress Notes (Signed)
 PROGRESS NOTE    Patient: Eric Vega                            PCP: [name], [name], FNP                    DOB: 01/04/1961            DOA: 08/06/2024 FMW:[id number]             DOS: 08/08/2024, 10:03 AM   LOS: 1 day   Date of Service: The patient was seen and examined on 08/08/2024  Subjective:   The patient was seen and examined this morning, stable, blood pressure has improved. Afebrile, hemodynamically stable, satting 98% room air Communication via interpreter  Brief Narrative:   Eric Vega is a 63 y.o. male with medical history significant of hypertension, T2DM, GERD who presents to the emergency department due to chills which started a few hours prior to arrival to the ED.  He states that while he was preparing his meals this evening, he started to have chills, so he went to bed with the hope that he would feel better on waking up, on the contrary, did not feel she was getting worse, so niece brought him to the ED for further evaluation and management.   ED Course:  In the emergency department, he was febrile (103.42F), tachypneic and tachycardic.  Workup in the ED showed normal CBC except for leukocytosis with WBC of 13.7.  BMP was normal except for glucose of 141.  Lactic acid x 2 was normal, alcohol level was undetectable.  Urine drug screen was normal, urinalysis was normal.  Influenza A, B, SARS-CoV-2, RSV was negative.  Blood culture pending. CT chest, abdomen and pelvis showed diffuse peribronchovascular vague patchy ground-glass airspace opacities that are most prominent within the right middle lobe.  No acute intra-abdominal or intrapelvic abnormality. IV cefepime , metronidazole  and vancomycin .  IV hydration was provided.    Assessment & Plan:   Principal Problem:   Sepsis due to pneumonia [facility]) Active Problems:   Type 2 diabetes mellitus with hyperglycemia (HCC)   Essential hypertension   GERD (gastroesophageal reflux disease)   Underweight (BMI <  18.5)      Sepsis due to pneumonia - Improved sepsis physiology, blood pressure stabilized,  POA:  met sepsis criteria due to being febrile, tachycardic and hyperleukocytosis, hypotension - CT chest with contrast was suggestive of pneumonia - In ED Patient got Cefepime , Metronidazole  and vancomycin ,  we will continue with continue ceftriaxone  and azithromycin   - will F/up with Blood culture, sputum culture, urine Legionella, strep pneumo   - Procalcitonin  0.26. Lactic Acid 1.1 - Continue Tylenol  as needed - Continue Mucinex , incentive spirometry, flutter valve    Essential hypertension - Hypotensive  - Status post IV fluid resuscitation, Midodrine  - Blood pressure stabilized, discontinue midodrine     Type 2 diabetes mellitus Hemoglobin A1c on 04/24/23 was 7.0;  - Repeat A1c 6.9 Advancing diet as tolerated, Checking CBG q. ACHS Kalisetti coverage   GERD Continue Protonix    Hypomagnesemia  - Magnesium  replaced 2 g IV  underweight (BMI 17.61) Protein supplement will be provided Dietitian will be consulted and we shall await further recommendations   The patient remains hemodynamically stable, transferring out of ICU    ----------------------------------------------------------------------------------------------------------- Nutritional status:  The patient's BMI is: Body mass index is 25.9 kg/m. I agree with the assessment and plan as outlined   -----------------------------------------------------------------------------------------------------------  Cultures; Blood Cultures x 2 >> no growth to date  Viral panel influenza A/B, RSV, SARS-CoV-2 all negative    ----------------------------------------------------------------------------------------------------------  DVT prophylaxis:  enoxaparin  (LOVENOX ) injection 40 mg Start: 08/07/24 1000 SCDs Start: 08/07/24 0410   Code Status:   Code Status: Full Code  Family Communication: No family member present  at bedside-  -Advance care planning has been discussed.   Admission status:   Status is: Inpatient Remains inpatient appropriate because: Admitted to stepdown, treating sepsis needing IV fluids IV antibiotics   Disposition: From  - home             Planning for discharge in 1-2 days   Procedures:   No admission procedures for hospital encounter.   Antimicrobials:  Anti-infectives (From admission, onward)    Start     Dose/Rate Route Frequency Ordered Stop   08/07/24 1000  azithromycin  (ZITHROMAX ) 500 mg in sodium chloride  0.9 % 250 mL IVPB        500 mg 250 mL/hr over 60 Minutes Intravenous Every 24 hours 08/07/24 0402     08/07/24 0800  cefTRIAXone  (ROCEPHIN ) 1 g in sodium chloride  0.9 % 100 mL IVPB        1 g 200 mL/hr over 30 Minutes Intravenous Every 24 hours 08/07/24 0402     08/07/24 0015  ceFEPIme  (MAXIPIME ) 2 g in sodium chloride  0.9 % 100 mL IVPB        2 g 200 mL/hr over 30 Minutes Intravenous  Once 08/07/24 0005 08/07/24 0120   08/07/24 0015  metroNIDAZOLE  (FLAGYL ) IVPB 500 mg        500 mg 100 mL/hr over 60 Minutes Intravenous  Once 08/07/24 0005 08/07/24 0216   08/07/24 0015  vancomycin  (VANCOCIN ) IVPB 1000 mg/200 mL premix        1,000 mg 200 mL/hr over 60 Minutes Intravenous  Once 08/07/24 0005 08/07/24 0217        Medication:   Chlorhexidine  Gluconate Cloth  6 each Topical Daily   dextromethorphan -guaiFENesin   1 tablet Oral BID   enoxaparin  (LOVENOX ) injection  40 mg Subcutaneous Q24H   feeding supplement (GLUCERNA SHAKE)  237 mL Oral TID BM   ferrous sulfate   325 mg Oral Q breakfast   insulin  aspart  0-9 Units Subcutaneous TID WC   multivitamin with minerals  1 tablet Oral Daily   pantoprazole   40 mg Oral Daily   tamsulosin   0.4 mg Oral QPC breakfast    acetaminophen  **OR** acetaminophen , gabapentin , ondansetron  **OR** ondansetron  (ZOFRAN ) IV   Objective:   Vitals:   08/08/24 0704 08/08/24 0730 08/08/24 0800 08/08/24 0900  BP:  (!) 148/71  (!) 159/75 128/72  Pulse:  78 77 85  Resp:  15 14 19   Temp: 97.7 F (36.5 C)     TempSrc: Oral     SpO2:  98% 98% 96%  Weight:      Height:        Intake/Output Summary (Last 24 hours) at 08/08/2024 1003 Last data filed at 08/08/2024 0800 Gross per 24 hour  Intake 789.62 ml  Output 4250 ml  Net -3460.38 ml   Filed Weights   08/06/24 2200 08/08/24 0348 08/08/24 0352  Weight: 48 kg 70.3 kg 70.6 kg     Physical examination:   General:  AAO x 3,  cooperative, no distress;   HEENT:  Normocephalic, PERRL, otherwise with in Normal limits   Neuro:  CNII-XII intact. , normal motor and sensation, reflexes intact   Lungs:  Clear to auscultation BL, Respirations unlabored,  No wheezes / crackles  Cardio:    S1/S2, RRR, No murmure, No Rubs or Gallops   Abdomen:  Soft, non-tender, bowel sounds active all four quadrants, no guarding or peritoneal signs.  Muscular  skeletal:  Limited exam -global generalized weaknesses - in bed, able to move all 4 extremities,   2+ pulses,  symmetric, No pitting edema  Skin:  Dry, warm to touch, negative for any Rashes,  Wounds: Please see nursing documentation     ----------------------------------------------------------------------------------------------------------    LABs:     Latest Ref Rng & Units 08/08/2024    4:48 AM 08/07/2024    4:38 AM 08/06/2024   10:29 PM  CBC  WBC 4.0 - 10.5 K/uL 10.4  13.8  13.7   Hemoglobin 13.0 - 17.0 g/dL 89.0  88.6  86.9   Hematocrit 39.0 - 52.0 % 32.8  33.7  37.8   Platelets 150 - 400 K/uL 210  205  213       Latest Ref Rng & Units 08/08/2024    4:48 AM 08/07/2024    4:38 AM 08/06/2024   10:29 PM  CMP  Glucose 70 - 99 mg/dL 888  862  858   BUN 8 - 23 mg/dL 14  18  22    Creatinine 0.61 - 1.24 mg/dL 9.33  9.28  9.27   Sodium 135 - 145 mmol/L 136  133  135   Potassium 3.5 - 5.1 mmol/L 3.5  3.9  4.4   Chloride 98 - 111 mmol/L 104  104  99   CO2 22 - 32 mmol/L 27  24  24    Calcium 8.9 - 10.3 mg/dL 8.1   8.0  9.1   Total Protein 6.5 - 8.1 g/dL 5.7  5.7  7.8   Total Bilirubin 0.0 - 1.2 mg/dL 0.4  0.7  0.6   Alkaline Phos 38 - 126 U/L 56  63  86   AST 15 - 41 U/L 15  19  22    ALT 0 - 44 U/L 15  16  21         Micro Results Recent Results (from the past 240 hours)  Resp panel by RT-PCR (RSV, Flu A&B, Covid) Anterior Nasal Swab     Status: None   Collection Time: 08/06/24 10:46 PM   Specimen: Anterior Nasal Swab  Result Value Ref Range Status   SARS Coronavirus 2 by RT PCR NEGATIVE NEGATIVE Final    Comment: (NOTE) SARS-CoV-2 target nucleic acids are NOT DETECTED.  The SARS-CoV-2 RNA is generally detectable in upper respiratory specimens during the acute phase of infection. The lowest concentration of SARS-CoV-2 viral copies this assay can detect is 138 copies/mL. A negative result does not preclude SARS-Cov-2 infection and should not be used as the sole basis for treatment or other patient management decisions. A negative result may occur with  improper specimen collection/handling, submission of specimen other than nasopharyngeal swab, presence of viral mutation(s) within the areas targeted by this assay, and inadequate number of viral copies(<138 copies/mL). A negative result must be combined with clinical observations, patient history, and epidemiological information. The expected result is Negative.  Fact Sheet for Patients:  BloggerCourse.com  Fact Sheet for Healthcare Providers:  SeriousBroker.it  This test is no t yet approved or cleared by the [place]  FDA and  has been authorized for detection and/or diagnosis of SARS-CoV-2 by FDA under an Emergency Use Authorization (EUA). This EUA will remain  in effect (  meaning this test can be used) for the duration of the COVID-19 declaration under Section 564(b)(1) of the Act, 21 U.S.C.section 360bbb-3(b)(1), unless the authorization is terminated  or revoked sooner.        Influenza A by PCR NEGATIVE NEGATIVE Final   Influenza B by PCR NEGATIVE NEGATIVE Final    Comment: (NOTE) The Xpert Xpress SARS-CoV-2/FLU/RSV plus assay is intended as an aid in the diagnosis of influenza from Nasopharyngeal swab specimens and should not be used as a sole basis for treatment. Nasal washings and aspirates are unacceptable for Xpert Xpress SARS-CoV-2/FLU/RSV testing.  Fact Sheet for Patients: BloggerCourse.com  Fact Sheet for Healthcare Providers: SeriousBroker.it  This test is not yet approved or cleared by the [place]  FDA and has been authorized for detection and/or diagnosis of SARS-CoV-2 by FDA under an Emergency Use Authorization (EUA). This EUA will remain in effect (meaning this test can be used) for the duration of the COVID-19 declaration under Section 564(b)(1) of the Act, 21 U.S.C. section 360bbb-3(b)(1), unless the authorization is terminated or revoked.     Resp Syncytial Virus by PCR NEGATIVE NEGATIVE Final    Comment: (NOTE) Fact Sheet for Patients: BloggerCourse.com  Fact Sheet for Healthcare Providers: SeriousBroker.it  This test is not yet approved or cleared by the [place]  FDA and has been authorized for detection and/or diagnosis of SARS-CoV-2 by FDA under an Emergency Use Authorization (EUA). This EUA will remain in effect (meaning this test can be used) for the duration of the COVID-19 declaration under Section 564(b)(1) of the Act, 21 U.S.C. section 360bbb-3(b)(1), unless the authorization is terminated or revoked.  Performed at [facility], [address], [place], [place] [address]   Culture, blood (single)     Status: None (Preliminary result)   Collection Time: 08/07/24 12:15 AM   Specimen: BLOOD LEFT HAND  Result Value Ref Range Status   Specimen Description BLOOD LEFT HAND  Final   Special Requests   Final     BOTTLES DRAWN AEROBIC AND ANAEROBIC Blood Culture adequate volume   Culture   Final    NO GROWTH < 12 HOURS Performed at [facility], [address] [place], [place] [address]    Report Status PENDING  Incomplete  MRSA Next Gen by PCR, Nasal     Status: None   Collection Time: 08/07/24  5:30 AM   Specimen: Nasal Mucosa; Nasal Swab  Result Value Ref Range Status   MRSA by PCR Next Gen NOT DETECTED NOT DETECTED Final    Comment: (NOTE) The GeneXpert MRSA Assay (FDA approved for NASAL specimens only), is one component of a comprehensive MRSA colonization surveillance program. It is not intended to diagnose MRSA infection nor to guide or monitor treatment for MRSA infections. Test performance is not FDA approved in patients less than 49 years old. Performed at [facility], [address] [place], [place] [address]     Radiology Reports No results found.   SIGNED: Adriana DELENA Grams, MD, FHM. FAAFP. Jolynn Pack - [facility] Time spent - 55 min.  In seeing, evaluating and examining the patient. Reviewing medical records, labs, drawn plan of care. [facility],  Pager (please use [email or web address] to page/ text) Please use Epic Secure Chat for non-urgent communication (7AM-7PM)  If 7PM-7AM, please contact night-coverage [email or web address], 08/08/2024, 10:03 AM

## 2024-08-08 NOTE — Plan of Care (Signed)
   Problem: Education: Goal: Knowledge of General Education information will improve Description: Including pain rating scale, medication(s)/side effects and non-pharmacologic comfort measures Outcome: Progressing   Problem: Health Behavior/Discharge Planning: Goal: Ability to manage health-related needs will improve Outcome: Progressing   Problem: Clinical Measurements: Goal: Ability to maintain clinical measurements within normal limits will improve Outcome: Progressing Goal: Will remain free from infection Outcome: Progressing Goal: Diagnostic test results will improve Outcome: Progressing Goal: Respiratory complications will improve Outcome: Progressing Goal: Cardiovascular complication will be avoided Outcome: Progressing   Problem: Activity: Goal: Risk for activity intolerance will decrease Outcome: Progressing   Problem: Nutrition: Goal: Adequate nutrition will be maintained Outcome: Progressing   Problem: Coping: Goal: Level of anxiety will decrease Outcome: Progressing   Problem: Elimination: Goal: Will not experience complications related to bowel motility Outcome: Progressing Goal: Will not experience complications related to urinary retention Outcome: Progressing   Problem: Pain Managment: Goal: General experience of comfort will improve and/or be controlled Outcome: Progressing   Problem: Safety: Goal: Ability to remain free from injury will improve Outcome: Progressing   Problem: Skin Integrity: Goal: Risk for impaired skin integrity will decrease Outcome: Progressing   Problem: Education: Goal: Ability to describe self-care measures that may prevent or decrease complications (Diabetes Survival Skills Education) will improve Outcome: Progressing Goal: Individualized Educational Video(s) Outcome: Progressing   Problem: Coping: Goal: Ability to adjust to condition or change in health will improve Outcome: Progressing   Problem: Fluid  Volume: Goal: Ability to maintain a balanced intake and output will improve Outcome: Progressing   Problem: Health Behavior/Discharge Planning: Goal: Ability to identify and utilize available resources and services will improve Outcome: Progressing Goal: Ability to manage health-related needs will improve Outcome: Progressing   Problem: Metabolic: Goal: Ability to maintain appropriate glucose levels will improve Outcome: Progressing   Problem: Nutritional: Goal: Maintenance of adequate nutrition will improve Outcome: Progressing Goal: Progress toward achieving an optimal weight will improve Outcome: Progressing   Problem: Skin Integrity: Goal: Risk for impaired skin integrity will decrease Outcome: Progressing   Problem: Tissue Perfusion: Goal: Adequacy of tissue perfusion will improve Outcome: Progressing   Problem: Activity: Goal: Ability to tolerate increased activity will improve Outcome: Progressing   Problem: Clinical Measurements: Goal: Ability to maintain a body temperature in the normal range will improve Outcome: Progressing   Problem: Respiratory: Goal: Ability to maintain adequate ventilation will improve Outcome: Progressing Goal: Ability to maintain a clear airway will improve Outcome: Progressing

## 2024-08-08 NOTE — TOC Progression Note (Signed)
 Transition of Care [facility]) - Progression Note    Patient Details  Name: Eric Vega MRN: [id number] Date of Birth: 11-09-1961  Transition of Care [facility]) CM/SW Contact  [place] Saddie Kim, [place] Phone Number: 08/08/2024, 11:46 AM  Clinical Narrative:  LCSW met with pt at bedside using interpreter line Adelita [phone]). Discussed PT evaluation with pt. Offered OOPT as pt has Medicaid only. Pt indicates he is not sure if this is needed and he will contact his PCP if he desires OOPT after d/c and reports his PCP office would assist with transportation. TOC will follow.        Barriers to Discharge: Continued Medical Work up               Expected Discharge Plan and Services                                               Social Drivers of Health (SDOH) Interventions SDOH Screenings   Food Insecurity: No Food Insecurity (08/07/2024)  Housing: Low Risk  (08/07/2024)  Transportation Needs: No Transportation Needs (08/07/2024)  Utilities: Not At Risk (08/07/2024)  Tobacco Use: Low Risk  (08/06/2024)    Readmission Risk Interventions    08/07/2024    9:59 AM  Readmission Risk Prevention Plan  Transportation Screening Complete  PCP or Specialist Appt within 5-7 Days Complete  Home Care Screening Complete  Medication Review (RN CM) Complete

## 2024-08-08 NOTE — Evaluation (Signed)
 Physical Therapy Evaluation Patient Details Name: Eric Vega MRN: [id number] DOB: 26-Dec-1960 Today's Date: 08/08/2024  History of Present Illness  Eric Vega is a 63 y.o. male with medical history significant of hypertension, T2DM, GERD who presents to the emergency department due to chills which started a few hours prior to arrival to the ED.  He states that while he was preparing his meals this evening, he started to have chills, so he went to bed with the hope that he would feel better on waking up, on the contrary, did not feel she was getting worse, so niece brought him to the ED for further evaluation and management.   Clinical Impression  Patient demonstrates independence with bed mobility tasks, but requires increased time to complete. He requires supervision to [facility] for safe transfers due to notable balance deficits when using SPC. Patient able to complete distance ambulation trial using SPC without LOB or becoming symptomatic but does present with significant lateral lean that impacts his standing balance leading to increased fall risk. Patient left in recliner with call bell in reach. Patient will benefit from continued skilled physical therapy in hospital and recommended venue below to increase strength, balance, endurance for safe ADLs and gait.         If plan is discharge home, recommend the following: Help with stairs or ramp for entrance;A little help with walking and/or transfers   Can travel by private vehicle        Equipment Recommendations None recommended by PT  Recommendations for Other Services       Functional Status Assessment Patient has had a recent decline in their functional status and demonstrates the ability to make significant improvements in function in a reasonable and predictable amount of time.     Precautions / Restrictions Precautions Precautions: Fall Recall of Precautions/Restrictions: Intact Restrictions Weight Bearing Restrictions  Per Provider Order: No      Mobility  Bed Mobility Overal bed mobility: Modified Independent             General bed mobility comments: increased time, labored movement    Transfers Overall transfer level: Needs assistance Equipment used: Straight cane Transfers: Sit to/from Stand, Bed to chair/wheelchair/BSC Sit to Stand: Supervision, Contact guard assist   Step pivot transfers: Supervision       General transfer comment: Pt slightly unsteady, standy by assist for safety    Ambulation/Gait Ambulation/Gait assistance: Contact guard assist, Supervision Gait Distance (Feet): 100 Feet Assistive device: Straight cane Gait Pattern/deviations: Decreased step length - right, Decreased step length - left, Decreased stride length Gait velocity: Decreased     General Gait Details: Slightly unsteady; CGA as pt has significant left lean with long distance ambulation  Stairs            Wheelchair Mobility     Tilt Bed    Modified Rankin (Stroke Patients Only)       Balance Overall balance assessment: Needs assistance Sitting-balance support: Feet supported, No upper extremity supported Sitting balance-Leahy Scale: Good Sitting balance - Comments: good seated EOB   Standing balance support: Single extremity supported, Reliant on assistive device for balance, During functional activity Standing balance-Leahy Scale: Fair Standing balance comment: fair/good using SPC                             Pertinent Vitals/Pain Pain Assessment Pain Assessment: No/denies pain    Home Living Family/patient expects to be discharged to:: Private residence  Living Arrangements: Alone Available Help at Discharge: Friend(s);Available PRN/intermittently;Family Type of Home: Mobile home Home Access: Stairs to enter Entrance Stairs-Rails: None Entrance Stairs-Number of Steps: 1   Home Layout: One level Home Equipment: Educational psychologist (4 wheels);Cane - single  point      Prior Function Prior Level of Function : Independent/Modified Independent             Mobility Comments: Household and community ambulator using [facility] ADLs Comments: Independent with ADLs, reports PRN assistance from friend with IADLs     Extremity/Trunk Assessment        Lower Extremity Assessment Lower Extremity Assessment: Generalized weakness    Cervical / Trunk Assessment Cervical / Trunk Assessment: Normal  Communication   Communication Communication: No apparent difficulties Surveyor, quantity)    Cognition Arousal: Alert Behavior During Therapy: WFL for tasks assessed/performed   PT - Cognitive impairments: No apparent impairments                         Following commands: Intact       Cueing Cueing Techniques: Verbal cues, Tactile cues     General Comments      Exercises     Assessment/Plan    PT Assessment Patient needs continued PT services  PT Problem List Decreased strength;Decreased balance;Decreased activity tolerance       PT Treatment Interventions DME instruction;Therapeutic activities;Therapeutic exercise;Patient/family education;Balance training;Functional mobility training    PT Goals (Current goals can be found in the Care Plan section)  Acute Rehab PT Goals Patient Stated Goal: return home PT Goal Formulation: With patient Time For Goal Achievement: 08/22/24 Potential to Achieve Goals: Good    Frequency Min 3X/week     Co-evaluation               AM-PAC PT 6 Clicks Mobility  Outcome Measure Help needed turning from your back to your side while in a flat bed without using bedrails?: None Help needed moving from lying on your back to sitting on the side of a flat bed without using bedrails?: A Little Help needed moving to and from a bed to a chair (including a wheelchair)?: A Little Help needed standing up from a chair using your arms (e.g., wheelchair or bedside chair)?: A Little Help needed  to walk in hospital room?: A Little Help needed climbing 3-5 steps with a railing? : A Lot 6 Click Score: 18    End of Session Equipment Utilized During Treatment: Gait belt Activity Tolerance: Patient tolerated treatment well Patient left: in chair;with nursing/sitter in room;with call bell/phone within reach Nurse Communication: Mobility status PT Visit Diagnosis: Unsteadiness on feet (R26.81);Other abnormalities of gait and mobility (R26.89);Muscle weakness (generalized) (M62.81)    Time: 9154-9091 PT Time Calculation (min) (ACUTE ONLY): 23 min   Charges:   PT Evaluation $PT Eval Moderate Complexity: 1 Mod PT Treatments $Therapeutic Activity: 23-37 mins PT General Charges $$ ACUTE PT VISIT: 1 Visit        10:41 AM, 08/08/24,  Onnie Como, SPT

## 2024-08-08 NOTE — Plan of Care (Signed)
  Problem: Acute Rehab PT Goals(only PT should resolve) Goal: Pt Will Go Supine/Side To Sit Outcome: Progressing Flowsheets (Taken 08/08/2024 1043) Pt will go Supine/Side to Sit: Independently Goal: Patient Will Transfer Sit To/From Stand Outcome: Progressing Flowsheets (Taken 08/08/2024 1043) Patient will transfer sit to/from stand: with modified independence Goal: Pt Will Transfer Bed To Chair/Chair To Bed Outcome: Progressing Flowsheets (Taken 08/08/2024 1043) Pt will Transfer Bed to Chair/Chair to Bed: with modified independence Goal: Pt Will Ambulate Outcome: Progressing Flowsheets (Taken 08/08/2024 1043) Pt will Ambulate:  with modified independence  with cane  > 125 feet

## 2024-08-08 NOTE — Progress Notes (Signed)
 Nurse at bedside,patient speaks spanish, the [employer] interpreting service used, Nurse introduced herself,and explained the plan of care to patient,verbalized understanding.Patient had no c/o pain or discomfort at this time.Plan of care on going.

## 2024-08-09 DIAGNOSIS — K219 Gastro-esophageal reflux disease without esophagitis: Secondary | ICD-10-CM | POA: Diagnosis not present

## 2024-08-09 DIAGNOSIS — J189 Pneumonia, unspecified organism: Secondary | ICD-10-CM | POA: Diagnosis not present

## 2024-08-09 DIAGNOSIS — A419 Sepsis, unspecified organism: Secondary | ICD-10-CM | POA: Diagnosis not present

## 2024-08-09 DIAGNOSIS — I10 Essential (primary) hypertension: Secondary | ICD-10-CM | POA: Diagnosis not present

## 2024-08-09 LAB — COMPREHENSIVE METABOLIC PANEL WITH GFR
ALT: 14 U/L (ref 0–44)
AST: 14 U/L — ABNORMAL LOW (ref 15–41)
Albumin: 3.2 g/dL — ABNORMAL LOW (ref 3.5–5.0)
Alkaline Phosphatase: 51 U/L (ref 38–126)
Anion gap: 6 (ref 5–15)
BUN: 14 mg/dL (ref 8–23)
CO2: 29 mmol/L (ref 22–32)
Calcium: 8.3 mg/dL — ABNORMAL LOW (ref 8.9–10.3)
Chloride: 105 mmol/L (ref 98–111)
Creatinine, Ser: 0.81 mg/dL (ref 0.61–1.24)
GFR, Estimated: >60 mL/min (ref >60)
Glucose, Bld: 133 mg/dL — ABNORMAL HIGH (ref 70–99)
Potassium: 3.7 mmol/L (ref 3.5–5.1)
Sodium: 140 mmol/L (ref 135–145)
Total Bilirubin: 0.4 mg/dL (ref 0.0–1.2)
Total Protein: 6 g/dL — ABNORMAL LOW (ref 6.5–8.1)

## 2024-08-09 LAB — CBC
HCT: 31.1 % — ABNORMAL LOW (ref 39.0–52.0)
Hemoglobin: 10.3 g/dL — ABNORMAL LOW (ref 13.0–17.0)
MCH: 29.6 pg (ref 26.0–34.0)
MCHC: 33.1 g/dL (ref 30.0–36.0)
MCV: 89.4 fL (ref 80.0–100.0)
Platelets: 201 K/uL (ref 150–400)
RBC: 3.48 MIL/uL — ABNORMAL LOW (ref 4.22–5.81)
RDW: 13 % (ref 11.5–15.5)
WBC: 6.2 K/uL (ref 4.0–10.5)
nRBC: 0 % (ref 0.0–0.2)

## 2024-08-09 LAB — LEGIONELLA PNEUMOPHILA SEROGP 1 UR AG: L. pneumophila Serogp 1 Ur Ag: NEGATIVE

## 2024-08-09 LAB — GLUCOSE, CAPILLARY: Glucose-Capillary: 99 mg/dL (ref 70–99)

## 2024-08-09 MED ORDER — DM-GUAIFENESIN ER 30-600 MG PO TB12: 1.0000 | ORAL_TABLET | Freq: Two times a day (BID) | ORAL | 0 refills | Status: AC

## 2024-08-09 MED ORDER — DOXYCYCLINE HYCLATE 100 MG PO CAPS: 100.0000 mg | ORAL_CAPSULE | Freq: Two times a day (BID) | ORAL | 0 refills | Status: AC

## 2024-08-09 MED ORDER — FLORANEX PO PACK: 1.0000 g | PACK | Freq: Three times a day (TID) | ORAL | 0 refills | Status: AC

## 2024-08-09 NOTE — Plan of Care (Signed)
   Problem: Education: Goal: Knowledge of General Education information will improve Description: Including pain rating scale, medication(s)/side effects and non-pharmacologic comfort measures Outcome: Progressing   Problem: Health Behavior/Discharge Planning: Goal: Ability to manage health-related needs will improve Outcome: Progressing   Problem: Clinical Measurements: Goal: Ability to maintain clinical measurements within normal limits will improve Outcome: Progressing Goal: Will remain free from infection Outcome: Progressing Goal: Diagnostic test results will improve Outcome: Progressing Goal: Respiratory complications will improve Outcome: Progressing Goal: Cardiovascular complication will be avoided Outcome: Progressing   Problem: Activity: Goal: Risk for activity intolerance will decrease Outcome: Progressing   Problem: Nutrition: Goal: Adequate nutrition will be maintained Outcome: Progressing   Problem: Coping: Goal: Level of anxiety will decrease Outcome: Progressing   Problem: Elimination: Goal: Will not experience complications related to bowel motility Outcome: Progressing Goal: Will not experience complications related to urinary retention Outcome: Progressing   Problem: Pain Managment: Goal: General experience of comfort will improve and/or be controlled Outcome: Progressing   Problem: Safety: Goal: Ability to remain free from injury will improve Outcome: Progressing   Problem: Skin Integrity: Goal: Risk for impaired skin integrity will decrease Outcome: Progressing   Problem: Education: Goal: Ability to describe self-care measures that may prevent or decrease complications (Diabetes Survival Skills Education) will improve Outcome: Progressing Goal: Individualized Educational Video(s) Outcome: Progressing   Problem: Coping: Goal: Ability to adjust to condition or change in health will improve Outcome: Progressing   Problem: Fluid  Volume: Goal: Ability to maintain a balanced intake and output will improve Outcome: Progressing   Problem: Health Behavior/Discharge Planning: Goal: Ability to identify and utilize available resources and services will improve Outcome: Progressing Goal: Ability to manage health-related needs will improve Outcome: Progressing   Problem: Metabolic: Goal: Ability to maintain appropriate glucose levels will improve Outcome: Progressing   Problem: Nutritional: Goal: Maintenance of adequate nutrition will improve Outcome: Progressing Goal: Progress toward achieving an optimal weight will improve Outcome: Progressing   Problem: Skin Integrity: Goal: Risk for impaired skin integrity will decrease Outcome: Progressing   Problem: Tissue Perfusion: Goal: Adequacy of tissue perfusion will improve Outcome: Progressing   Problem: Activity: Goal: Ability to tolerate increased activity will improve Outcome: Progressing   Problem: Clinical Measurements: Goal: Ability to maintain a body temperature in the normal range will improve Outcome: Progressing   Problem: Respiratory: Goal: Ability to maintain adequate ventilation will improve Outcome: Progressing Goal: Ability to maintain a clear airway will improve Outcome: Progressing

## 2024-08-09 NOTE — Discharge Summary (Signed)
 Physician Discharge Summary  Eric Vega FMW:[id number] DOB: Jan 10, 1961 DOA: 08/06/2024  PCP: Vega, Eric C, FNP  Admit date: 08/06/2024 Discharge date: 08/09/2024  Admitted From:  Home  Disposition: Home   Recommendations for Outpatient Follow-up:  Follow up with PCP in 1 weeks  Discharge Condition: STABLE   CODE STATUS: FULL DIET: resume prior home diet    Brief Hospitalization Summary: Please see all hospital notes, images, labs for full details of the hospitalization. Admission provider HPI:    63 y.o. male with medical history significant of hypertension, T2DM, GERD who presents to the emergency department due to chills which started a few hours prior to arrival to the ED.  He states that while he was preparing his meals this evening, he started to have chills, so he went to bed with the hope that he would feel better on waking up, on the contrary, did not feel she was getting worse, so niece brought him to the ED for further evaluation and management.   ED Course:  In the emergency department, he was febrile (103.70F), tachypneic and tachycardic.  Workup in the ED showed normal CBC except for leukocytosis with WBC of 13.7.  BMP was normal except for glucose of 141.  Lactic acid x 2 was normal, alcohol level was undetectable.  Urine drug screen was normal, urinalysis was normal.  Influenza A, B, SARS-CoV-2, RSV was negative.  Blood culture pending.  CT chest, abdomen and pelvis showed diffuse peribronchovascular vague patchy ground-glass airspace opacities that are most prominent within the right middle lobe.  No acute intra-abdominal or intrapelvic abnormality.  IV cefepime , metronidazole  and vancomycin .  IV hydration was provided.  Hospital Course by listed problems addressed  Sepsis due to pneumonia  - Resolved  - Improved sepsis physiology, blood pressure stabilized   POA:  met sepsis criteria due to being febrile, tachycardic and hyperleukocytosis, hypotension - CT chest with  contrast was suggestive of pneumonia - In ED Patient got Cefepime , Metronidazole  and vancomycin , we will continue with oral doxycycline  at discharge to complete full course    - Blood culture, sputum culture, urine Legionella, strep pneumo - negative to date   - Procalcitonin  0.26. Lactic Acid 1.1 - Continue Tylenol  as needed - Continue Mucinex , incentive spirometry, flutter valve    Essential hypertension - Hypotensive  - Status post IV fluid resuscitation, Midodrine  - Blood pressure stabilized, discontinued midodrine     Type 2 diabetes mellitus Hemoglobin A1c on 04/24/23 was 7.0;  - Repeat A1c 6.9   GERD Continue Protonix    Hypomagnesemia - Repleted  - Magnesium  replaced 2 g IV   underweight (BMI 17.61) Moderate protein calorie malnutrition Protein supplement provided Dietitian will be consulted   Discharge Diagnoses:  Principal Problem:   Sepsis due to pneumonia [facility]) Active Problems:   Malnutrition of moderate degree   Type 2 diabetes mellitus with hyperglycemia (HCC)   Essential hypertension   GERD (gastroesophageal reflux disease)   Underweight (BMI < 18.5)   Discharge Instructions:  Allergies as of 08/09/2024   No Known Allergies      Medication List     STOP taking these medications    losartan 50 MG tablet Commonly known as: COZAAR       TAKE these medications    dextromethorphan -guaiFENesin  30-600 MG 12hr tablet Commonly known as: MUCINEX  DM Take 1 tablet by mouth 2 (two) times daily for 5 days.   doxycycline  100 MG capsule Commonly known as: VIBRAMYCIN  Take 1 capsule (100 mg total) by  mouth 2 (two) times daily for 4 days. Start taking on: August 10, 2024   ergocalciferol 1.25 MG (50000 UT) capsule Commonly known as: VITAMIN D2 Take 1 capsule by mouth once a week.   ferrous sulfate  325 (65 FE) MG tablet Take 1 tablet (325 mg total) by mouth daily with breakfast.   gabapentin  300 MG capsule Commonly known as: NEURONTIN  Take 300 mg  by mouth 2 (two) times daily as needed (neuropathy, nerve pain).   ipratropium-albuterol  0.5-2.5 (3) MG/3ML Soln Commonly known as: DUONEB Take 3 mLs by nebulization every 6 (six) hours as needed.   lactobacillus Pack Take 1 packet (1 g total) by mouth 3 (three) times daily with meals for 14 days.   metFORMIN  500 MG tablet Commonly known as: GLUCOPHAGE  Take 1 tablet (500 mg total) by mouth daily with breakfast.   multivitamin capsule Take 1 capsule by mouth daily.   ondansetron  4 MG disintegrating tablet Commonly known as: ZOFRAN -ODT Take 1 tablet (4 mg total) by mouth every 8 (eight) hours as needed.   pantoprazole  40 MG tablet Commonly known as: Protonix  Take 1 tablet (40 mg total) by mouth daily.   tamsulosin  0.4 MG Caps capsule Commonly known as: FLOMAX  Take 1 capsule (0.4 mg total) by mouth daily after breakfast.        Follow-up Information     Vega, Eric C, FNP. Schedule an appointment as soon as possible for a visit in 1 week(s).   Specialty: Family Medicine Why: Hospital Follow Up Contact information: [address] [place] [address] [phone]                No Known Allergies Allergies as of 08/09/2024   No Known Allergies      Medication List     STOP taking these medications    losartan 50 MG tablet Commonly known as: COZAAR       TAKE these medications    dextromethorphan -guaiFENesin  30-600 MG 12hr tablet Commonly known as: MUCINEX  DM Take 1 tablet by mouth 2 (two) times daily for 5 days.   doxycycline  100 MG capsule Commonly known as: VIBRAMYCIN  Take 1 capsule (100 mg total) by mouth 2 (two) times daily for 4 days. Start taking on: August 10, 2024   ergocalciferol 1.25 MG (50000 UT) capsule Commonly known as: VITAMIN D2 Take 1 capsule by mouth once a week.   ferrous sulfate  325 (65 FE) MG tablet Take 1 tablet (325 mg total) by mouth daily with breakfast.   gabapentin  300 MG capsule Commonly known as:  NEURONTIN  Take 300 mg by mouth 2 (two) times daily as needed (neuropathy, nerve pain).   ipratropium-albuterol  0.5-2.5 (3) MG/3ML Soln Commonly known as: DUONEB Take 3 mLs by nebulization every 6 (six) hours as needed.   lactobacillus Pack Take 1 packet (1 g total) by mouth 3 (three) times daily with meals for 14 days.   metFORMIN  500 MG tablet Commonly known as: GLUCOPHAGE  Take 1 tablet (500 mg total) by mouth daily with breakfast.   multivitamin capsule Take 1 capsule by mouth daily.   ondansetron  4 MG disintegrating tablet Commonly known as: ZOFRAN -ODT Take 1 tablet (4 mg total) by mouth every 8 (eight) hours as needed.   pantoprazole  40 MG tablet Commonly known as: Protonix  Take 1 tablet (40 mg total) by mouth daily.   tamsulosin  0.4 MG Caps capsule Commonly known as: FLOMAX  Take 1 capsule (0.4 mg total) by mouth daily after breakfast.  Procedures/Studies: CT CHEST ABDOMEN PELVIS W CONTRAST Result Date: 08/07/2024 CLINICAL DATA:  Sepsis mental status change. Unsure of pt complaint. Pt in hospital provided wheelchair with blanket covering him from head to toe. Pt just moans. When asking pt what we are seeing him for, pt states cold. EXAM: CT CHEST, ABDOMEN, AND PELVIS WITH CONTRAST TECHNIQUE: Multidetector CT imaging of the chest, abdomen and pelvis was performed following the standard protocol during bolus administration of intravenous contrast. RADIATION DOSE REDUCTION: This exam was performed according to the departmental dose-optimization program which includes automated exposure control, adjustment of the mA and/or kV according to patient size and/or use of iterative reconstruction technique. CONTRAST:  OMNIPAQUE  IOHEXOL  300 MG/ML  SOLN COMPARISON:  Chest x-ray 08/06/2024, chest x-ray 07/16/2023, CT abdomen pelvis 08/28/2023 FINDINGS: CT CHEST FINDINGS Cardiovascular: Normal heart size. No significant pericardial effusion. The thoracic aorta is normal in  caliber. Moderate to severe atherosclerotic plaque of the thoracic aorta and its main branches. At least 2 vessel coronary artery calcifications. Mediastinum/Nodes: No enlarged mediastinal, hilar, or axillary lymph nodes. Thyroid  gland, trachea, and esophagus demonstrate no significant findings. Lungs/Pleura: Diffuse peribronchovascular vague patchy ground-glass airspace opacities that are most prominent within the right middle lobe. No pulmonary mass. No pleural effusion. No pneumothorax. Musculoskeletal: No chest wall abnormality. No suspicious lytic or blastic osseous lesions. No acute displaced fracture. Old healed bilateral rib fractures. Plate and screw fixation of the left ribs. CT ABDOMEN PELVIS FINDINGS Hepatobiliary: No focal liver abnormality. No gallstones, gallbladder wall thickening, or pericholecystic fluid. No biliary dilatation. Pancreas: Diffusely atrophic. No focal lesion. Otherwise normal pancreatic contour. No surrounding inflammatory changes. No main pancreatic ductal dilatation. Spleen: Normal in size without focal abnormality. Adrenals/Urinary Tract: No adrenal nodule bilaterally. Bilateral kidneys enhance symmetrically. No hydronephrosis. No hydroureter. The urinary bladder is unremarkable. Stomach/Bowel: Stomach is within normal limits. No evidence of bowel wall thickening or dilatation. Stool throughout the majority of the colon. Appendix appears normal. Vascular/Lymphatic: No abdominal aorta or iliac aneurysm. Moderate atherosclerotic plaque of the aorta and its branches. No abdominal, pelvic, or inguinal lymphadenopathy. Reproductive: Prostate is unremarkable. Other: No intraperitoneal free fluid. No intraperitoneal free gas. No organized fluid collection. Musculoskeletal: No abdominal wall hernia or abnormality. No suspicious lytic or blastic osseous lesions. No acute displaced fracture. Multilevel degenerative changes of the spine. Partially visualized intramedullary nail fixation of  the left IMPRESSION: 1. Diffuse peribronchovascular vague patchy ground-glass airspace opacities that are most prominent within the right middle lobe. Findings suggestive of infection/inflammation. Recommend follow-up CT in 3 months to evaluate for complete resolution. 2. No acute intra-abdominal or intrapelvic abnormality. Electronically Signed   By: Morgane  Naveau M.D.   On: 08/07/2024 01:03   CT Head Wo Contrast Result Date: 08/07/2024 CLINICAL DATA:  Mental status change, unknown cause EXAM: CT HEAD WITHOUT CONTRAST TECHNIQUE: Contiguous axial images were obtained from the base of the skull through the vertex without intravenous contrast. RADIATION DOSE REDUCTION: This exam was performed according to the departmental dose-optimization program which includes automated exposure control, adjustment of the mA and/or kV according to patient size and/or use of iterative reconstruction technique. COMPARISON:  MRI head 03/05/2024, CT head 03/05/2024 FINDINGS: Brain: No evidence of large-territorial acute infarction. No parenchymal hemorrhage. No mass lesion. No extra-axial collection. No mass effect or midline shift. No hydrocephalus. Basilar cisterns are patent. Vascular: No hyperdense vessel. Atherosclerotic calcifications are present within the cavernous internal carotid arteries. Skull: No acute fracture or focal lesion. Sinuses/Orbits: Paranasal sinuses and mastoid air cells  are clear. The orbits are unremarkable. Other: None. IMPRESSION: No acute intracranial abnormality. Electronically Signed   By: Morgane  Naveau M.D.   On: 08/07/2024 00:57   DG Chest Portable 1 View Result Date: 08/06/2024 CLINICAL DATA:  r/o PNA EXAM: PORTABLE CHEST 1 VIEW COMPARISON:  Chest x-ray 07/16/2023, CT chest 11/13/2022 FINDINGS: The heart and mediastinal contours are within normal limits. Low lung volumes. No focal consolidation. No pulmonary edema. No pleural effusion. No pneumothorax. No acute osseous abnormality.  Similar-appearing plate and screw fixation of the left ribs. Old healed right rib fractures. IMPRESSION: Low lung volumes with no active disease. Electronically Signed   By: Morgane  Naveau M.D.   On: 08/06/2024 23:21     Subjective: Pt reports feeling good overall and much better, he is on room air oxygen.  Spanish interpreter used to communicate.   Discharge Exam: Vitals:   08/08/24 2157 08/09/24 0458  BP: (!) 103/57 114/64  Pulse: 65 (!) 57  Resp: 19 18  Temp: 97.8 F (36.6 C) 98.7 F (37.1 C)  SpO2: 97% 97%   Vitals:   08/08/24 1734 08/08/24 2157 08/09/24 0458 08/09/24 0500  BP: 133/75 (!) 103/57 114/64   Pulse: 82 65 (!) 57   Resp: 18 19 18    Temp: 97.8 F (36.6 C) 97.8 F (36.6 C) 98.7 F (37.1 C)   TempSrc: Oral Oral    SpO2: 97% 97% 97%   Weight:    69.2 kg  Height:        General: Pt is alert, awake, not in acute distress Cardiovascular: RRR, S1/S2 +, no rubs, no gallops Respiratory: CTA bilaterally, no wheezing, no rhonchi Abdominal: Soft, NT, ND, bowel sounds + Extremities: no edema, no cyanosis   The results of significant diagnostics from this hospitalization (including imaging, microbiology, ancillary and laboratory) are listed below for reference.     Microbiology: Recent Results (from the past 240 hours)  Resp panel by RT-PCR (RSV, Flu A&B, Covid) Anterior Nasal Swab     Status: None   Collection Time: 08/06/24 10:46 PM   Specimen: Anterior Nasal Swab  Result Value Ref Range Status   SARS Coronavirus 2 by RT PCR NEGATIVE NEGATIVE Final    Comment: (NOTE) SARS-CoV-2 target nucleic acids are NOT DETECTED.  The SARS-CoV-2 RNA is generally detectable in upper respiratory specimens during the acute phase of infection. The lowest concentration of SARS-CoV-2 viral copies this assay can detect is 138 copies/mL. A negative result does not preclude SARS-Cov-2 infection and should not be used as the sole basis for treatment or other patient management  decisions. A negative result may occur with  improper specimen collection/handling, submission of specimen other than nasopharyngeal swab, presence of viral mutation(s) within the areas targeted by this assay, and inadequate number of viral copies(<138 copies/mL). A negative result must be combined with clinical observations, patient history, and epidemiological information. The expected result is Negative.  Fact Sheet for Patients:  BloggerCourse.com  Fact Sheet for Healthcare Providers:  SeriousBroker.it  This test is no t yet approved or cleared by the [place]  FDA and  has been authorized for detection and/or diagnosis of SARS-CoV-2 by FDA under an Emergency Use Authorization (EUA). This EUA will remain  in effect (meaning this test can be used) for the duration of the COVID-19 declaration under Section 564(b)(1) of the Act, 21 U.S.C.section 360bbb-3(b)(1), unless the authorization is terminated  or revoked sooner.       Influenza A by PCR NEGATIVE NEGATIVE Final  Influenza B by PCR NEGATIVE NEGATIVE Final    Comment: (NOTE) The Xpert Xpress SARS-CoV-2/FLU/RSV plus assay is intended as an aid in the diagnosis of influenza from Nasopharyngeal swab specimens and should not be used as a sole basis for treatment. Nasal washings and aspirates are unacceptable for Xpert Xpress SARS-CoV-2/FLU/RSV testing.  Fact Sheet for Patients: BloggerCourse.com  Fact Sheet for Healthcare Providers: SeriousBroker.it  This test is not yet approved or cleared by the [place]  FDA and has been authorized for detection and/or diagnosis of SARS-CoV-2 by FDA under an Emergency Use Authorization (EUA). This EUA will remain in effect (meaning this test can be used) for the duration of the COVID-19 declaration under Section 564(b)(1) of the Act, 21 U.S.C. section 360bbb-3(b)(1), unless the  authorization is terminated or revoked.     Resp Syncytial Virus by PCR NEGATIVE NEGATIVE Final    Comment: (NOTE) Fact Sheet for Patients: BloggerCourse.com  Fact Sheet for Healthcare Providers: SeriousBroker.it  This test is not yet approved or cleared by the [place]  FDA and has been authorized for detection and/or diagnosis of SARS-CoV-2 by FDA under an Emergency Use Authorization (EUA). This EUA will remain in effect (meaning this test can be used) for the duration of the COVID-19 declaration under Section 564(b)(1) of the Act, 21 U.S.C. section 360bbb-3(b)(1), unless the authorization is terminated or revoked.  Performed at [facility], [address] [place], [place] [address]   Culture, blood (single)     Status: None (Preliminary result)   Collection Time: 08/07/24 12:15 AM   Specimen: BLOOD LEFT HAND  Result Value Ref Range Status   Specimen Description BLOOD LEFT HAND  Final   Special Requests   Final    BOTTLES DRAWN AEROBIC AND ANAEROBIC Blood Culture adequate volume   Culture   Final    NO GROWTH 2 DAYS Performed at [facility], [address], [place], [place] [address]    Report Status PENDING  Incomplete  MRSA Next Gen by PCR, Nasal     Status: None   Collection Time: 08/07/24  5:30 AM   Specimen: Nasal Mucosa; Nasal Swab  Result Value Ref Range Status   MRSA by PCR Next Gen NOT DETECTED NOT DETECTED Final    Comment: (NOTE) The GeneXpert MRSA Assay (FDA approved for NASAL specimens only), is one component of a comprehensive MRSA colonization surveillance program. It is not intended to diagnose MRSA infection nor to guide or monitor treatment for MRSA infections. Test performance is not FDA approved in patients less than 45 years old. Performed at [facility], [address] [place], [place] [address]      Labs: BNP (last 3 results) No results for input(s): BNP in the last 8760 hours. Basic  Metabolic Panel: Recent Labs  Lab 08/06/24 2229 08/07/24 0438 08/08/24 0448 08/09/24 0423  NA 135 133* 136 140  K 4.4 3.9 3.5 3.7  CL 99 104 104 105  CO2 24 24 27 29   GLUCOSE 141* 137* 111* 133*  BUN 22 18 14 14   CREATININE 0.72 0.71 0.66 0.81  CALCIUM 9.1 8.0* 8.1* 8.3*  MG  --  1.6*  --   --   PHOS  --  3.2  --   --    Liver Function Tests: Recent Labs  Lab 08/06/24 2229 08/07/24 0438 08/08/24 0448 08/09/24 0423  AST 22 19 15  14*  ALT 21 16 15 14   ALKPHOS 86 63 56 51  BILITOT 0.6 0.7 0.4 0.4  PROT  7.8 5.7* 5.7* 6.0*  ALBUMIN  4.1 2.9* 2.9* 3.2*   No results for input(s): LIPASE, AMYLASE in the last 168 hours. No results for input(s): AMMONIA in the last 168 hours. CBC: Recent Labs  Lab 08/06/24 2229 08/07/24 0438 08/08/24 0448 08/09/24 0423  WBC 13.7* 13.8* 10.4 6.2  NEUTROABS 11.1*  --   --   --   HGB 13.0 11.3* 10.9* 10.3*  HCT 37.8* 33.7* 32.8* 31.1*  MCV 87.1 88.2 87.9 89.4  PLT 213 205 210 201   Cardiac Enzymes: No results for input(s): CKTOTAL, CKMB, CKMBINDEX, TROPONINI in the last 168 hours. BNP: Invalid input(s): POCBNP CBG: Recent Labs  Lab 08/08/24 0703 08/08/24 1105 08/08/24 1604 08/08/24 2200 08/09/24 0726  GLUCAP 93 146* 145* 164* 99   D-Dimer No results for input(s): DDIMER in the last 72 hours. Hgb A1c Recent Labs    08/07/24 0438  HGBA1C 6.9*   Lipid Profile No results for input(s): CHOL, HDL, LDLCALC, TRIG, CHOLHDL, LDLDIRECT in the last 72 hours. Thyroid  function studies No results for input(s): TSH, T4TOTAL, T3FREE, THYROIDAB in the last 72 hours.  Invalid input(s): FREET3 Anemia work up No results for input(s): VITAMINB12, FOLATE, FERRITIN, TIBC, IRON, RETICCTPCT in the last 72 hours. Urinalysis    Component Value Date/Time   COLORURINE STRAW (A) 08/06/2024 2246   APPEARANCEUR CLEAR 08/06/2024 2246   LABSPEC 1.008 08/06/2024 2246   PHURINE 8.0 08/06/2024 2246    GLUCOSEU NEGATIVE 08/06/2024 2246   HGBUR SMALL (A) 08/06/2024 2246   BILIRUBINUR NEGATIVE 08/06/2024 2246   KETONESUR NEGATIVE 08/06/2024 2246   PROTEINUR 30 (A) 08/06/2024 2246   NITRITE NEGATIVE 08/06/2024 2246   LEUKOCYTESUR NEGATIVE 08/06/2024 2246   Sepsis Labs Recent Labs  Lab 08/06/24 2229 08/07/24 0438 08/08/24 0448 08/09/24 0423  WBC 13.7* 13.8* 10.4 6.2   Microbiology Recent Results (from the past 240 hours)  Resp panel by RT-PCR (RSV, Flu A&B, Covid) Anterior Nasal Swab     Status: None   Collection Time: 08/06/24 10:46 PM   Specimen: Anterior Nasal Swab  Result Value Ref Range Status   SARS Coronavirus 2 by RT PCR NEGATIVE NEGATIVE Final    Comment: (NOTE) SARS-CoV-2 target nucleic acids are NOT DETECTED.  The SARS-CoV-2 RNA is generally detectable in upper respiratory specimens during the acute phase of infection. The lowest concentration of SARS-CoV-2 viral copies this assay can detect is 138 copies/mL. A negative result does not preclude SARS-Cov-2 infection and should not be used as the sole basis for treatment or other patient management decisions. A negative result may occur with  improper specimen collection/handling, submission of specimen other than nasopharyngeal swab, presence of viral mutation(s) within the areas targeted by this assay, and inadequate number of viral copies(<138 copies/mL). A negative result must be combined with clinical observations, patient history, and epidemiological information. The expected result is Negative.  Fact Sheet for Patients:  BloggerCourse.com  Fact Sheet for Healthcare Providers:  SeriousBroker.it  This test is no t yet approved or cleared by the [place]  FDA and  has been authorized for detection and/or diagnosis of SARS-CoV-2 by FDA under an Emergency Use Authorization (EUA). This EUA will remain  in effect (meaning this test can be used) for the  duration of the COVID-19 declaration under Section 564(b)(1) of the Act, 21 U.S.C.section 360bbb-3(b)(1), unless the authorization is terminated  or revoked sooner.       Influenza A by PCR NEGATIVE NEGATIVE Final   Influenza B by PCR NEGATIVE NEGATIVE  Final    Comment: (NOTE) The Xpert Xpress SARS-CoV-2/FLU/RSV plus assay is intended as an aid in the diagnosis of influenza from Nasopharyngeal swab specimens and should not be used as a sole basis for treatment. Nasal washings and aspirates are unacceptable for Xpert Xpress SARS-CoV-2/FLU/RSV testing.  Fact Sheet for Patients: BloggerCourse.com  Fact Sheet for Healthcare Providers: SeriousBroker.it  This test is not yet approved or cleared by the [place]  FDA and has been authorized for detection and/or diagnosis of SARS-CoV-2 by FDA under an Emergency Use Authorization (EUA). This EUA will remain in effect (meaning this test can be used) for the duration of the COVID-19 declaration under Section 564(b)(1) of the Act, 21 U.S.C. section 360bbb-3(b)(1), unless the authorization is terminated or revoked.     Resp Syncytial Virus by PCR NEGATIVE NEGATIVE Final    Comment: (NOTE) Fact Sheet for Patients: BloggerCourse.com  Fact Sheet for Healthcare Providers: SeriousBroker.it  This test is not yet approved or cleared by the [place]  FDA and has been authorized for detection and/or diagnosis of SARS-CoV-2 by FDA under an Emergency Use Authorization (EUA). This EUA will remain in effect (meaning this test can be used) for the duration of the COVID-19 declaration under Section 564(b)(1) of the Act, 21 U.S.C. section 360bbb-3(b)(1), unless the authorization is terminated or revoked.  Performed at [facility], [address], [place], [place] [address]   Culture, blood (single)     Status: None (Preliminary result)    Collection Time: 08/07/24 12:15 AM   Specimen: BLOOD LEFT HAND  Result Value Ref Range Status   Specimen Description BLOOD LEFT HAND  Final   Special Requests   Final    BOTTLES DRAWN AEROBIC AND ANAEROBIC Blood Culture adequate volume   Culture   Final    NO GROWTH 2 DAYS Performed at [facility], [address], [place], [place] [address]    Report Status PENDING  Incomplete  MRSA Next Gen by PCR, Nasal     Status: None   Collection Time: 08/07/24  5:30 AM   Specimen: Nasal Mucosa; Nasal Swab  Result Value Ref Range Status   MRSA by PCR Next Gen NOT DETECTED NOT DETECTED Final    Comment: (NOTE) The GeneXpert MRSA Assay (FDA approved for NASAL specimens only), is one component of a comprehensive MRSA colonization surveillance program. It is not intended to diagnose MRSA infection nor to guide or monitor treatment for MRSA infections. Test performance is not FDA approved in patients less than 42 years old. Performed at [facility], [address] [place], [place] [address]    Time coordinating discharge: 33 mins  SIGNED:  Afton Louder, MD  [facility] 08/09/2024, 10:04 AM How to contact the [facility] Attending or Consulting provider 7A - 7P or covering provider during after hours 7P -7A, for this patient?  Check the care team in [facility] and look for a) attending/consulting [facility] provider listed and b) the [facility] team listed Log into [email or web address] and use [facility] universal password to access. If you do not have the password, please contact the hospital operator. Locate the [facility] provider you are looking for under [facility] and page to a number that you can be directly reached. If you still have difficulty reaching the provider, please page the [facility] (Director on Call) for the Hospitalists listed on amion for assistance.

## 2024-08-09 NOTE — Plan of Care (Signed)
 Problem: Education: Goal: Knowledge of General Education information will improve Description: Including pain rating scale, medication(s)/side effects and non-pharmacologic comfort measures 08/09/2024 1007 by Mathews Norleen POUR, RN Outcome: Adequate for Discharge 08/09/2024 0910 by Mathews Norleen POUR, RN Outcome: Progressing   Problem: Health Behavior/Discharge Planning: Goal: Ability to manage health-related needs will improve 08/09/2024 1007 by Mathews Norleen POUR, RN Outcome: Adequate for Discharge 08/09/2024 0910 by Mathews Norleen POUR, RN Outcome: Progressing   Problem: Clinical Measurements: Goal: Ability to maintain clinical measurements within normal limits will improve 08/09/2024 1007 by Mathews Norleen POUR, RN Outcome: Adequate for Discharge 08/09/2024 0910 by Mathews Norleen POUR, RN Outcome: Progressing Goal: Will remain free from infection 08/09/2024 1007 by Mathews Norleen POUR, RN Outcome: Adequate for Discharge 08/09/2024 0910 by Mathews Norleen POUR, RN Outcome: Progressing Goal: Diagnostic test results will improve 08/09/2024 1007 by Mathews Norleen POUR, RN Outcome: Adequate for Discharge 08/09/2024 0910 by Mathews Norleen POUR, RN Outcome: Progressing Goal: Respiratory complications will improve 08/09/2024 1007 by Mathews Norleen POUR, RN Outcome: Adequate for Discharge 08/09/2024 0910 by Mathews Norleen POUR, RN Outcome: Progressing Goal: Cardiovascular complication will be avoided 08/09/2024 1007 by Mathews Norleen POUR, RN Outcome: Adequate for Discharge 08/09/2024 0910 by Mathews Norleen POUR, RN Outcome: Progressing   Problem: Activity: Goal: Risk for activity intolerance will decrease 08/09/2024 1007 by Mathews Norleen POUR, RN Outcome: Adequate for Discharge 08/09/2024 0910 by Mathews Norleen POUR, RN Outcome: Progressing   Problem: Nutrition: Goal: Adequate nutrition will be maintained 08/09/2024 1007 by Mathews Norleen POUR, RN Outcome: Adequate for Discharge 08/09/2024 0910 by Mathews Norleen POUR, RN Outcome: Progressing   Problem: Coping: Goal: Level of anxiety  will decrease 08/09/2024 1007 by Mathews Norleen POUR, RN Outcome: Adequate for Discharge 08/09/2024 0910 by Mathews Norleen POUR, RN Outcome: Progressing   Problem: Elimination: Goal: Will not experience complications related to bowel motility 08/09/2024 1007 by Mathews Norleen POUR, RN Outcome: Adequate for Discharge 08/09/2024 0910 by Mathews Norleen POUR, RN Outcome: Progressing Goal: Will not experience complications related to urinary retention 08/09/2024 1007 by Mathews Norleen POUR, RN Outcome: Adequate for Discharge 08/09/2024 0910 by Mathews Norleen POUR, RN Outcome: Progressing   Problem: Pain Managment: Goal: General experience of comfort will improve and/or be controlled 08/09/2024 1007 by Mathews Norleen POUR, RN Outcome: Adequate for Discharge 08/09/2024 0910 by Mathews Norleen POUR, RN Outcome: Progressing   Problem: Safety: Goal: Ability to remain free from injury will improve 08/09/2024 1007 by Mathews Norleen POUR, RN Outcome: Adequate for Discharge 08/09/2024 0910 by Mathews Norleen POUR, RN Outcome: Progressing   Problem: Skin Integrity: Goal: Risk for impaired skin integrity will decrease 08/09/2024 1007 by Mathews Norleen POUR, RN Outcome: Adequate for Discharge 08/09/2024 0910 by Mathews Norleen POUR, RN Outcome: Progressing   Problem: Education: Goal: Ability to describe self-care measures that may prevent or decrease complications (Diabetes Survival Skills Education) will improve 08/09/2024 1007 by Mathews Norleen POUR, RN Outcome: Adequate for Discharge 08/09/2024 0910 by Mathews Norleen POUR, RN Outcome: Progressing Goal: Individualized Educational Video(s) 08/09/2024 1007 by Mathews Norleen POUR, RN Outcome: Adequate for Discharge 08/09/2024 0910 by Mathews Norleen POUR, RN Outcome: Progressing   Problem: Coping: Goal: Ability to adjust to condition or change in health will improve 08/09/2024 1007 by Mathews Norleen POUR, RN Outcome: Adequate for Discharge 08/09/2024 0910 by Mathews Norleen POUR, RN Outcome: Progressing   Problem: Fluid Volume: Goal: Ability to maintain  a balanced intake and output will improve 08/09/2024 1007 by Mathews Norleen POUR, RN Outcome: Adequate for Discharge 08/09/2024  9089 by Mathews Norleen POUR, RN Outcome: Progressing   Problem: Health Behavior/Discharge Planning: Goal: Ability to identify and utilize available resources and services will improve 08/09/2024 1007 by Mathews Norleen POUR, RN Outcome: Adequate for Discharge 08/09/2024 0910 by Mathews Norleen POUR, RN Outcome: Progressing Goal: Ability to manage health-related needs will improve 08/09/2024 1007 by Araiya Tilmon, Norleen POUR, RN Outcome: Adequate for Discharge 08/09/2024 0910 by Mathews Norleen POUR, RN Outcome: Progressing   Problem: Metabolic: Goal: Ability to maintain appropriate glucose levels will improve 08/09/2024 1007 by Mathews Norleen POUR, RN Outcome: Adequate for Discharge 08/09/2024 0910 by Mathews Norleen POUR, RN Outcome: Progressing   Problem: Nutritional: Goal: Maintenance of adequate nutrition will improve 08/09/2024 1007 by Mathews Norleen POUR, RN Outcome: Adequate for Discharge 08/09/2024 0910 by Mathews Norleen POUR, RN Outcome: Progressing Goal: Progress toward achieving an optimal weight will improve 08/09/2024 1007 by Mathews Norleen POUR, RN Outcome: Adequate for Discharge 08/09/2024 0910 by Mathews Norleen POUR, RN Outcome: Progressing   Problem: Skin Integrity: Goal: Risk for impaired skin integrity will decrease 08/09/2024 1007 by Mathews Norleen POUR, RN Outcome: Adequate for Discharge 08/09/2024 0910 by Mathews Norleen POUR, RN Outcome: Progressing   Problem: Tissue Perfusion: Goal: Adequacy of tissue perfusion will improve 08/09/2024 1007 by Mathews Norleen POUR, RN Outcome: Adequate for Discharge 08/09/2024 0910 by Mathews Norleen POUR, RN Outcome: Progressing   Problem: Activity: Goal: Ability to tolerate increased activity will improve 08/09/2024 1007 by Mathews Norleen POUR, RN Outcome: Adequate for Discharge 08/09/2024 0910 by Mathews Norleen POUR, RN Outcome: Progressing   Problem: Clinical Measurements: Goal: Ability to maintain a body  temperature in the normal range will improve 08/09/2024 1007 by Mathews Norleen POUR, RN Outcome: Adequate for Discharge 08/09/2024 0910 by Mathews Norleen POUR, RN Outcome: Progressing   Problem: Respiratory: Goal: Ability to maintain adequate ventilation will improve 08/09/2024 1007 by Mathews Norleen POUR, RN Outcome: Adequate for Discharge 08/09/2024 0910 by Mathews Norleen POUR, RN Outcome: Progressing Goal: Ability to maintain a clear airway will improve 08/09/2024 1007 by Mathews Norleen POUR, RN Outcome: Adequate for Discharge 08/09/2024 0910 by Mathews Norleen POUR, RN Outcome: Progressing

## 2024-08-12 LAB — CULTURE, BLOOD (SINGLE)
Culture: NO GROWTH
Special Requests: ADEQUATE

## 2024-10-16 ENCOUNTER — Encounter (INDEPENDENT_AMBULATORY_CARE_PROVIDER_SITE_OTHER): Payer: Self-pay | Admitting: *Deleted

## 2024-12-01 ENCOUNTER — Emergency Department (HOSPITAL_COMMUNITY)

## 2024-12-01 ENCOUNTER — Emergency Department (HOSPITAL_COMMUNITY)
Admission: EM | Admit: 2024-12-01 | Discharge: 2024-12-01 | Disposition: A | Discharge status: Home / Self Care | Attending: Emergency Medicine | Admitting: Emergency Medicine

## 2024-12-01 DIAGNOSIS — I10 Essential (primary) hypertension: Secondary | ICD-10-CM | POA: Diagnosis not present

## 2024-12-01 DIAGNOSIS — Z794 Long term (current) use of insulin: Secondary | ICD-10-CM | POA: Diagnosis not present

## 2024-12-01 DIAGNOSIS — Z79899 Other long term (current) drug therapy: Secondary | ICD-10-CM | POA: Insufficient documentation

## 2024-12-01 DIAGNOSIS — E1165 Type 2 diabetes mellitus with hyperglycemia: Secondary | ICD-10-CM | POA: Diagnosis not present

## 2024-12-01 DIAGNOSIS — K2900 Acute gastritis without bleeding: Secondary | ICD-10-CM | POA: Insufficient documentation

## 2024-12-01 DIAGNOSIS — R1012 Left upper quadrant pain: Secondary | ICD-10-CM | POA: Diagnosis present

## 2024-12-01 DIAGNOSIS — R739 Hyperglycemia, unspecified: Secondary | ICD-10-CM

## 2024-12-01 DIAGNOSIS — Z7984 Long term (current) use of oral hypoglycemic drugs: Secondary | ICD-10-CM | POA: Diagnosis not present

## 2024-12-01 LAB — URINALYSIS, ROUTINE W REFLEX MICROSCOPIC
Bacteria, UA: NONE SEEN
Bilirubin Urine: NEGATIVE
Glucose, UA: >=500 mg/dL — AB
Hgb urine dipstick: NEGATIVE
Ketones, ur: NEGATIVE mg/dL
Leukocytes,Ua: NEGATIVE
Nitrite: NEGATIVE
Protein, ur: NEGATIVE mg/dL
Specific Gravity, Urine: 1.024 (ref 1.005–1.030)
pH: 7 (ref 5.0–8.0)

## 2024-12-01 LAB — CBC WITH DIFFERENTIAL/PLATELET
Abs Immature Granulocytes: 0.04 K/uL (ref 0.00–0.07)
Basophils Absolute: 0 K/uL (ref 0.0–0.1)
Basophils Relative: 0 %
Eosinophils Absolute: 0.2 K/uL (ref 0.0–0.5)
Eosinophils Relative: 2 %
HCT: 38.4 % — ABNORMAL LOW (ref 39.0–52.0)
Hemoglobin: 12.8 g/dL — ABNORMAL LOW (ref 13.0–17.0)
Immature Granulocytes: 0 %
Lymphocytes Relative: 19 %
Lymphs Abs: 2 K/uL (ref 0.7–4.0)
MCH: 28.4 pg (ref 26.0–34.0)
MCHC: 33.3 g/dL (ref 30.0–36.0)
MCV: 85.3 fL (ref 80.0–100.0)
Monocytes Absolute: 0.6 K/uL (ref 0.1–1.0)
Monocytes Relative: 6 %
Neutro Abs: 7.3 K/uL (ref 1.7–7.7)
Neutrophils Relative %: 73 %
Platelets: 214 K/uL (ref 150–400)
RBC: 4.5 MIL/uL (ref 4.22–5.81)
RDW: 12 % (ref 11.5–15.5)
WBC: 10.1 K/uL (ref 4.0–10.5)
nRBC: 0 % (ref 0.0–0.2)

## 2024-12-01 LAB — COMPREHENSIVE METABOLIC PANEL WITH GFR
ALT: 20 U/L (ref 0–44)
AST: 17 U/L (ref 15–41)
Albumin: 4.2 g/dL (ref 3.5–5.0)
Alkaline Phosphatase: 154 U/L — ABNORMAL HIGH (ref 38–126)
Anion gap: 8 (ref 5–15)
BUN: 24 mg/dL — ABNORMAL HIGH (ref 8–23)
CO2: 31 mmol/L (ref 22–32)
Calcium: 9.3 mg/dL (ref 8.9–10.3)
Chloride: 92 mmol/L — ABNORMAL LOW (ref 98–111)
Creatinine, Ser: 0.96 mg/dL (ref 0.61–1.24)
GFR, Estimated: >60 mL/min
Glucose, Bld: 616 mg/dL (ref 70–99)
Potassium: 4.7 mmol/L (ref 3.5–5.1)
Sodium: 131 mmol/L — ABNORMAL LOW (ref 135–145)
Total Bilirubin: 0.3 mg/dL (ref 0.0–1.2)
Total Protein: 7.5 g/dL (ref 6.5–8.1)

## 2024-12-01 LAB — LIPASE, BLOOD: Lipase: 24 U/L (ref 11–51)

## 2024-12-01 LAB — CBG MONITORING, ED: Glucose-Capillary: 380 mg/dL — ABNORMAL HIGH (ref 70–99)

## 2024-12-01 LAB — MAGNESIUM: Magnesium: 2.3 mg/dL (ref 1.7–2.4)

## 2024-12-01 MED ORDER — SODIUM CHLORIDE 0.9 % IV BOLUS
1000.0000 mL | Freq: Once | INTRAVENOUS | Status: AC
  Administered 2024-12-01: 1000 mL via INTRAVENOUS

## 2024-12-01 MED ORDER — ONDANSETRON HCL 4 MG/2ML IJ SOLN
4.0000 mg | Freq: Once | INTRAMUSCULAR | Status: AC
  Administered 2024-12-01: 4 mg via INTRAVENOUS
  Filled 2024-12-01: qty 2

## 2024-12-01 MED ORDER — INSULIN ASPART 100 UNIT/ML IJ SOLN
10.0000 [IU] | Freq: Once | INTRAMUSCULAR | Status: AC
  Administered 2024-12-01: 10 [IU] via SUBCUTANEOUS
  Filled 2024-12-01: qty 1

## 2024-12-01 MED ORDER — PANTOPRAZOLE SODIUM 40 MG PO TBEC: 40.0000 mg | DELAYED_RELEASE_TABLET | Freq: Every day | ORAL | 2 refills | Status: AC

## 2024-12-01 MED ORDER — ALUM & MAG HYDROXIDE-SIMETH 200-200-20 MG/5ML PO SUSP
30.0000 mL | Freq: Once | ORAL | Status: AC
  Administered 2024-12-01: 30 mL via ORAL
  Filled 2024-12-01: qty 30

## 2024-12-01 MED ORDER — FAMOTIDINE 20 MG PO TABS
40.0000 mg | ORAL_TABLET | Freq: Once | ORAL | Status: AC
  Administered 2024-12-01: 40 mg via ORAL
  Filled 2024-12-01: qty 2

## 2024-12-01 MED ORDER — IOHEXOL 300 MG/ML  SOLN
100.0000 mL | Freq: Once | INTRAMUSCULAR | Status: AC | PRN
  Administered 2024-12-01: 100 mL via INTRAVENOUS

## 2024-12-01 NOTE — Discharge Instructions (Addendum)
 Le evaluamos por su dolor de [place]. Los [employer] fueron, en general, tranquilizadores. Sospechamos que sus sntomas se deben a una gastritis. Por favor, consulte con su mdico de cabecera. Le hemos recetado un anticido para que lo tome en casa. Tmelo segn las indicaciones.  Tambin notamos que su nivel de azcar en sangre estaba muy alto. Recibi tratamiento en el servicio de urgencias y [place]. Asegrese de tomar sus medicamentos segn lo recetado. Consulte con su mdico de cabecera para que pueda ajustar su medicacin si es necesario. Lleve un registro de sus niveles de azcar en sangre en casa.  Si presenta algn sntoma nuevo o que empeora, como dolor intenso, dificultad para respirar, mareos o vrtigo, fiebre o escalofros, vmitos incontrolables o cualquier otro sntoma nuevo, regrese al servicio de urgencias.  We evaluated you for your stomach pain.  Your testing was overall reassuring.  We suspect your symptoms are due to gastritis.  Please follow-up with your primary doctor.  We have prescribed you an antiacid medication to take at home.  Please take this as prescribed.  We also noticed your blood sugar was very high.  You received treatment in the emergency department and it improved.  Please make sure you are taking your medications as prescribed.  Please follow-up closely with your primary doctor so they can adjust your medicines if necessary.  Please keep a log of your blood sugar at home.  If you have any new or worsening symptoms such as worsening pain, difficulty breathing, lightheadedness or dizziness, fevers or chills, uncontrolled vomiting, or any other new symptom, please return to the emergency department.

## 2024-12-01 NOTE — ED Triage Notes (Signed)
 Pt comes in for n/v, abd pain since this morning. Pt has thrown up 6 times since this morning.   Denies diarrhea pain or CP. Pt urinating okay.      Pt needs engineer, structural
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
